# Patient Record
Sex: Male | Born: 1975
Health system: Southern US, Community
[De-identification: ages and names within clinical notes are randomized; demographics above are authoritative.]

## PROBLEM LIST (undated history)

## (undated) DIAGNOSIS — K219 Gastro-esophageal reflux disease without esophagitis: Secondary | ICD-10-CM

## (undated) HISTORY — DX: Gastro-esophageal reflux disease without esophagitis: K21.9

---

## 2002-11-03 ENCOUNTER — Emergency Department (HOSPITAL_COMMUNITY): Admission: EM | Admit: 2002-11-03 | Discharge: 2002-11-03 | Payer: Self-pay | Admitting: Emergency Medicine

## 2012-09-03 ENCOUNTER — Ambulatory Visit: Payer: Self-pay | Admitting: Internal Medicine

## 2012-09-03 ENCOUNTER — Ambulatory Visit: Payer: Self-pay

## 2012-09-03 VITALS — BP 112/60 | HR 54 | Temp 97.8°F | Resp 16 | Ht 68.0 in | Wt 189.0 lb

## 2012-09-03 DIAGNOSIS — R002 Palpitations: Secondary | ICD-10-CM

## 2012-09-03 DIAGNOSIS — R079 Chest pain, unspecified: Secondary | ICD-10-CM

## 2012-09-03 DIAGNOSIS — R0602 Shortness of breath: Secondary | ICD-10-CM

## 2012-09-03 MED ORDER — ALBUTEROL SULFATE HFA 108 (90 BASE) MCG/ACT IN AERS: 2.0000 | INHALATION_SPRAY | Freq: Four times a day (QID) | RESPIRATORY_TRACT | Status: DC | PRN

## 2012-09-03 NOTE — Patient Instructions (Signed)
Nicotine chewing gum What is this medicine? NICOTINE (NIK [place] teen) helps people stop smoking. This medicine replaces the nicotine found in cigarettes and helps to decrease withdrawal effects. It is most effective when used in combination with a stop-smoking program. This medicine may be used for other purposes; ask your health care provider or pharmacist if you have questions. What should I tell my health care provider before I take this medicine? They need to know if you have any of these conditions: -diabetes -heart disease, angina, irregular heartbeat or previous heart attack -lung disease, including asthma -overactive thyroid -pheochromocytoma -stomach problems or ulcers -an unusual or allergic reaction to nicotine, other medicines, foods, dyes, or preservatives -pregnant or trying to get pregnant -breast-feeding How should I use this medicine? Chew but do not swallow the gum. Follow the directions that come with the chewing gum. Use exactly as directed. When you feel an urgent desire for a cigarette, chew one piece of gum slowly. Continue chewing until you taste the gum or feel a slight tingling in your mouth. Then, stop chewing and place the gum between your cheek and gum. Wait until the taste or tingling is almost gone then start chewing again. Continue chewing in this manner for about 30 minutes. Slow chewing helps reduce cravings and also helps reduce the chance for heartburn or other gastrointestinal side effects. Talk to your pediatrician regarding the use of this medicine in children. Special care may be needed. Overdosage: If you think you have taken too much of this medicine contact a poison control center or emergency room at once. NOTE: This medicine is only for you. Do not share this medicine with others. What if I miss a dose? This does not apply. Only use the chewing gum when you have a strong desire to smoke. Do not use more than one piece of gum at a time. What may interact  with this medicine? -medicines for asthma -medicines for blood pressure -medicines for mental depression This list may not describe all possible interactions. Give your health care provider a list of all the medicines, herbs, non-prescription drugs, or dietary supplements you use. Also tell them if you smoke, drink alcohol, or use illegal drugs. Some items may interact with your medicine. What should I watch for while using this medicine? Always carry the nicotine gum with you. Do not smoke while you are using the chewing gum. Do not use more than 30 pieces of gum a day. Too much gum can increase the risk of an overdose. As the urge to smoke gets less, gradually reduce the number of pieces each day over a period of 2 to 3 months. When you are only using 1 or 2 pieces a day, stop using the nicotine gum. If your mouth gets sore from chewing the gum, suck hard sugarless candy between pieces of gum to help relieve the soreness. Brush your teeth regularly to reduce mouth irritation. If you wear dentures, contact your doctor or health care professional if the gum sticks to your dental work. If you are a diabetic and you quit smoking, the effects of insulin may be increased and you may need to reduce your insulin dose. Check with your doctor or health care professional about how you should adjust your insulin dose. What side effects may I notice from receiving this medicine? Side effects that you should report to your doctor or health care professional as soon as possible: -allergic reactions like skin rash, itching or hives, swelling of the face,  lips, or tongue -blisters in mouth -breathing problems -changes in hearing -changes in vision -chest pain -cold sweats -confusion -fast, irregular heartbeat -feeling faint or lightheaded, falls -headache -increased saliva -nausea, vomiting -stomach pain -weakness Side effects that usually do not require medical attention (report to your doctor or health  care professional if they continue or are bothersome): -diarrhea -dry mouth -hiccups -irritability -nervousness or restlessness -trouble sleeping or vivid dreams This list may not describe all possible side effects. Call your doctor for medical advice about side effects. You may report side effects to FDA at 1-800-FDA-1088. Where should I keep my medicine? Keep out of the reach of children. Store at room temperature between 15 and 30 degrees C (59 and 86 degrees F). Protect from heat and light. Throw away unused medicine after the expiration date. NOTE: This sheet is a summary. It may not cover all possible information. If you have questions about this medicine, talk to your doctor, pharmacist, or health care provider.  2012, [employer]. (05/22/2010 1:00:52 PM)Smoking Cessation, Tips for Success YOU CAN QUIT SMOKING If you are ready to quit smoking, congratulations! You have chosen to help yourself be healthier. Cigarettes bring nicotine, tar, carbon monoxide, and other irritants into your body. Your lungs, heart, and blood vessels will be able to work better without these poisons. There are many different ways to quit smoking. Nicotine gum, nicotine patches, a nicotine inhaler, or nicotine nasal spray can help with physical craving. Hypnosis, support groups, and medicines help break the habit of smoking. Here are some tips to help you quit for good.  Throw away all cigarettes.  Clean and remove all ashtrays from your home, work, and car.  On a card, write down your reasons for quitting. Carry the card with you and read it when you get the urge to smoke.  Cleanse your body of nicotine. Drink enough water and fluids to keep your urine clear or pale yellow. Do this after quitting to flush the nicotine from your body.  Learn to predict your moods. Do not let a bad situation be your excuse to have a cigarette. Some situations in your life might tempt you into wanting a  cigarette.  Never have "just one" cigarette. It leads to wanting another and another. Remind yourself of your decision to quit.  Change habits associated with smoking. If you smoked while driving or when feeling stressed, try other activities to replace smoking. Stand up when drinking your coffee. Brush your teeth after eating. Sit in a different chair when you read the paper. Avoid alcohol while trying to quit, and try to drink fewer caffeinated beverages. Alcohol and caffeine may urge you to smoke.  Avoid foods and drinks that can trigger a desire to smoke, such as sugary or spicy foods and alcohol.  Ask people who smoke not to smoke around you.  Have something planned to do right after eating or having a cup of coffee. Take a walk or exercise to perk you up. This will help to keep you from overeating.  Try a relaxation exercise to calm you down and decrease your stress. Remember, you may be tense and nervous for the first 2 weeks after you quit, but this will pass.  Find new activities to keep your hands busy. Play with a pen, coin, or rubber band. Doodle or draw things on paper.  Brush your teeth right after eating. This will help cut down on the craving for the taste of tobacco after meals. You can  try mouthwash, too.  Use oral substitutes, such as lemon drops, carrots, a cinnamon stick, or chewing gum, in place of cigarettes. Keep them handy so they are available when you have the urge to smoke.  When you have the urge to smoke, try deep breathing.  Designate your home as a nonsmoking area.  If you are a heavy smoker, ask your caregiver about a prescription for nicotine chewing gum. It can ease your withdrawal from nicotine.  Reward yourself. Set aside the cigarette money you save and buy yourself something nice.  Look for support from others. Join a support group or smoking cessation program. Ask someone at home or at work to help you with your plan to quit smoking.  Always ask  yourself, "Do I need this cigarette or is this just a reflex?" Tell yourself, "Today, I choose not to smoke," or "I do not want to smoke." You are reminding yourself of your decision to quit, even if you do smoke a cigarette. HOW WILL I FEEL WHEN I QUIT SMOKING?  The benefits of not smoking start within days of quitting.  You may have symptoms of withdrawal because your body is used to nicotine (the addictive substance in cigarettes). You may crave cigarettes, be irritable, feel very hungry, cough often, get headaches, or have difficulty concentrating.  The withdrawal symptoms are only temporary. They are strongest when you first quit but will go away within 10 to 14 days.  When withdrawal symptoms occur, stay in control. Think about your reasons for quitting. Remind yourself that these are signs that your body is healing and getting used to being without cigarettes.  Remember that withdrawal symptoms are easier to treat than the major diseases that smoking can cause.  Even after the withdrawal is over, expect periodic urges to smoke. However, these cravings are generally short-lived and will go away whether you smoke or not. Do not smoke!  If you relapse and smoke again, do not lose hope. Most smokers quit 3 times before they are successful.  If you relapse, do not give up! Plan ahead and think about what you will do the next time you get the urge to smoke. LIFE AS A NONSMOKER: MAKE IT FOR A MONTH, MAKE IT FOR LIFE Day 1: Hang this page where you will see it every day. Day 2: Get rid of all ashtrays, matches, and lighters. Day 3: Drink water. Breathe deeply between sips. Day 4: Avoid places with smoke-filled air, such as bars, clubs, or the smoking section of restaurants. Day 5: Keep track of how much money you save by not smoking. Day 6: Avoid boredom. Keep a good book with you or go to the movies. Day 7: Reward yourself! One week without smoking! Day 8: Make a dental appointment to get  your teeth cleaned. Day 9: Decide how you will turn down a cigarette before it is offered to you. Day 10: Review your reasons for quitting. Day 11: Distract yourself. Stay active to keep your mind off smoking and to relieve tension. Take a walk, exercise, read a book, do a crossword puzzle, or try a new hobby. Day 12: Exercise. Get off the bus before your stop or use stairs instead of escalators. Day 13: Call on friends for support and encouragement. Day 14: Reward yourself! Two weeks without smoking! Day 15: Practice deep breathing exercises. Day 16: Bet a friend that you can stay a nonsmoker. Day 17: Ask to sit in nonsmoking sections of restaurants. Day 18: [employer]  up "No Smoking" signs. Day 19: Think of yourself as a nonsmoker. Day 20: Each morning, tell yourself you will not smoke. Day 21: Reward yourself! Three weeks without smoking! Day 22: Think of smoking in negative ways. Remember how it stains your teeth, gives you bad breath, and leaves you short of breath. Day 23: Eat a nutritious breakfast. Day 24:Do not relive your days as a smoker. Day 25: Hold a pencil in your hand when talking on the telephone. Day 26: Tell all your friends you do not smoke. Day 27: Think about how much better food tastes. Day 28: Remember, one cigarette is one too many. Day 29: Take up a hobby that will keep your hands busy. Day 30: Congratulations! One month without smoking! Give yourself a big reward. Your caregiver can direct you to community resources or hospitals for support, which may include:  Group support.  Education.  Hypnosis.  Subliminal therapy. Document Released: 12/15/2003 Document Revised: 06/10/2011 Document Reviewed: 01/02/2009 [facility] Patient Information 2014 [place], [place].

## 2012-09-03 NOTE — Progress Notes (Signed)
  Subjective:    Patient ID: Brandon Ward, male    DOB: Oct 21, 1975, 37 y.o.   MRN: [id number]  HPI Speaks poor english, is anxious, smoking 2 packs per day. Hard to catch breath or take deep breath. No hemoptysis, minimal cough. Is ready to stop smoking. Stat EKG is normal   Review of Systems     Objective:   Physical Exam  Vitals reviewed. Constitutional: He is oriented to person, place, and time. He appears well-developed and well-nourished. No distress.  HENT:  Right Ear: External ear normal.  Left Ear: External ear normal.  Mouth/Throat: Posterior oropharyngeal erythema present.  Eyes: Conjunctivae and EOM are normal. Pupils are equal, round, and reactive to light.  Cardiovascular: Normal rate, regular rhythm, normal heart sounds and intact distal pulses.   Pulmonary/Chest: Not tachypneic. No respiratory distress. He has decreased breath sounds. He has no wheezes. He has rhonchi. He has no rales. He exhibits no tenderness.  Neurological: He is alert and oriented to person, place, and time. No cranial nerve deficit. He exhibits normal muscle tone. Coordination normal.  Skin: No rash noted.  Psychiatric: His speech is normal and behavior is normal. Judgment and thought content normal. His mood appears anxious. Cognition and memory are normal.     [facility] reading (PRIMARY) by  Dr Perrin Maltese Hyperinflation, 2pk/day smoker  No results found for this or any previous visit.       Assessment & Plan:  Quit smoking/Nicorrette Albuterol inhaler qid prn Reck Sunday 3pm

## 2014-06-08 ENCOUNTER — Encounter (HOSPITAL_COMMUNITY): Payer: Self-pay | Admitting: Emergency Medicine

## 2014-06-08 ENCOUNTER — Emergency Department (HOSPITAL_COMMUNITY)
Admission: EM | Admit: 2014-06-08 | Discharge: 2014-06-08 | Disposition: A | Discharge status: Home / Self Care | Payer: Self-pay | Attending: Emergency Medicine | Admitting: Emergency Medicine

## 2014-06-08 ENCOUNTER — Emergency Department (HOSPITAL_COMMUNITY): Payer: Self-pay

## 2014-06-08 DIAGNOSIS — R0600 Dyspnea, unspecified: Secondary | ICD-10-CM

## 2014-06-08 DIAGNOSIS — F17210 Nicotine dependence, cigarettes, uncomplicated: Secondary | ICD-10-CM

## 2014-06-08 DIAGNOSIS — R0609 Other forms of dyspnea: Secondary | ICD-10-CM | POA: Insufficient documentation

## 2014-06-08 DIAGNOSIS — R0602 Shortness of breath: Secondary | ICD-10-CM

## 2014-06-08 DIAGNOSIS — Z720 Tobacco use: Secondary | ICD-10-CM | POA: Insufficient documentation

## 2014-06-08 DIAGNOSIS — Z79899 Other long term (current) drug therapy: Secondary | ICD-10-CM | POA: Insufficient documentation

## 2014-06-08 LAB — CBC WITH DIFFERENTIAL/PLATELET
BASOS PCT: 0 % (ref 0–1)
Basophils Absolute: 0 10*3/uL (ref 0.0–0.1)
EOS PCT: 4 % (ref 0–5)
Eosinophils Absolute: 0.3 10*3/uL (ref 0.0–0.7)
HCT: 45.4 % (ref 39.0–52.0)
Hemoglobin: 15.4 g/dL (ref 13.0–17.0)
LYMPHS ABS: 2.3 10*3/uL (ref 0.7–4.0)
Lymphocytes Relative: 34 % (ref 12–46)
MCH: 30.3 pg (ref 26.0–34.0)
MCHC: 33.9 g/dL (ref 30.0–36.0)
MCV: 89.2 fL (ref 78.0–100.0)
Monocytes Absolute: 0.4 10*3/uL (ref 0.1–1.0)
Monocytes Relative: 6 % (ref 3–12)
NEUTROS ABS: 3.9 10*3/uL (ref 1.7–7.7)
NEUTROS PCT: 56 % (ref 43–77)
PLATELETS: 250 10*3/uL (ref 150–400)
RBC: 5.09 MIL/uL (ref 4.22–5.81)
RDW: 13.1 % (ref 11.5–15.5)
WBC: 6.9 10*3/uL (ref 4.0–10.5)

## 2014-06-08 LAB — BASIC METABOLIC PANEL
ANION GAP: 4 — AB (ref 5–15)
BUN: 15 mg/dL (ref 6–23)
CALCIUM: 9.3 mg/dL (ref 8.4–10.5)
CHLORIDE: 105 mmol/L (ref 96–112)
CO2: 27 mmol/L (ref 19–32)
Creatinine, Ser: 0.93 mg/dL (ref 0.50–1.35)
GFR calc Af Amer: >90 mL/min (ref >90)
GFR calc non Af Amer: >90 mL/min (ref >90)
Glucose, Bld: 103 mg/dL — ABNORMAL HIGH (ref 70–99)
Potassium: 4 mmol/L (ref 3.5–5.1)
SODIUM: 136 mmol/L (ref 135–145)

## 2014-06-08 LAB — I-STAT TROPONIN, ED: TROPONIN I, POC: 0 ng/mL (ref 0.00–0.08)

## 2014-06-08 MED ORDER — ALBUTEROL SULFATE HFA 108 (90 BASE) MCG/ACT IN AERS
2.0000 | INHALATION_SPRAY | RESPIRATORY_TRACT | Status: DC | PRN
  Administered 2014-06-08: 2 via RESPIRATORY_TRACT
  Filled 2014-06-08: qty 6.7

## 2014-06-08 MED ORDER — PREDNISONE 10 MG PO TABS: 20.0000 mg | ORAL_TABLET | Freq: Every day | ORAL | Status: DC

## 2014-06-08 NOTE — Discharge Instructions (Signed)
Smoking Cessation Quitting smoking is important to your health and has many advantages. However, it is not always easy to quit since nicotine is a very addictive drug. Oftentimes, people try 3 times or more before being able to quit. This document explains the best ways for you to prepare to quit smoking. Quitting takes hard work and a lot of effort, but you can do it. ADVANTAGES OF QUITTING SMOKING  You will live longer, feel better, and live better.  Your body will feel the impact of quitting smoking almost immediately.  Within 20 minutes, blood pressure decreases. Your pulse returns to its normal level.  After 8 hours, carbon monoxide levels in the blood return to normal. Your oxygen level increases.  After 24 hours, the chance of having a heart attack starts to decrease. Your breath, hair, and body stop smelling like smoke.  After 48 hours, damaged nerve endings begin to recover. Your sense of taste and smell improve.  After 72 hours, the body is virtually free of nicotine. Your bronchial tubes relax and breathing becomes easier.  After 2 to 12 weeks, lungs can hold more air. Exercise becomes easier and circulation improves.  The risk of having a heart attack, stroke, cancer, or lung disease is greatly reduced.  After 1 year, the risk of coronary heart disease is cut in half.  After 5 years, the risk of stroke falls to the same as a nonsmoker.  After 10 years, the risk of lung cancer is cut in half and the risk of other cancers decreases significantly.  After 15 years, the risk of coronary heart disease drops, usually to the level of a nonsmoker.  If you are pregnant, quitting smoking will improve your chances of having a healthy baby.  The people you live with, especially any children, will be healthier.  You will have extra money to spend on things other than cigarettes. QUESTIONS TO THINK ABOUT BEFORE ATTEMPTING TO QUIT You may want to talk about your answers with your  health care provider.  Why do you want to quit?  If you tried to quit in the past, what helped and what did not?  What will be the most difficult situations for you after you quit? How will you plan to handle them?  Who can help you through the tough times? Your family? Friends? A health care provider?  What pleasures do you get from smoking? What ways can you still get pleasure if you quit? Here are some questions to ask your health care provider:  How can you help me to be successful at quitting?  What medicine do you think would be best for me and how should I take it?  What should I do if I need more help?  What is smoking withdrawal like? How can I get information on withdrawal? GET READY  Set a quit date.  Change your environment by getting rid of all cigarettes, ashtrays, matches, and lighters in your home, car, or work. Do not let people smoke in your home.  Review your past attempts to quit. Think about what worked and what did not. GET SUPPORT AND ENCOURAGEMENT You have a better chance of being successful if you have help. You can get support in many ways.  Tell your family, friends, and coworkers that you are going to quit and need their support. Ask them not to smoke around you.  Get individual, group, or telephone counseling and support. Programs are available at local hospitals and health centers. Call   your local health department for information about programs in your area.  Spiritual beliefs and practices may help some smokers quit.  Download a "quit meter" on your computer to keep track of quit statistics, such as how long you have gone without smoking, cigarettes not smoked, and money saved.  Get a self-help book about quitting smoking and staying off tobacco. LEARN NEW SKILLS AND BEHAVIORS  Distract yourself from urges to smoke. Talk to someone, go for a walk, or occupy your time with a task.  Change your normal routine. Take a different route to work.  Drink tea instead of coffee. Eat breakfast in a different place.  Reduce your stress. Take a hot bath, exercise, or read a book.  Plan something enjoyable to do every day. Reward yourself for not smoking.  Explore interactive web-based programs that specialize in helping you quit. GET MEDICINE AND USE IT CORRECTLY Medicines can help you stop smoking and decrease the urge to smoke. Combining medicine with the above behavioral methods and support can greatly increase your chances of successfully quitting smoking.  Nicotine replacement therapy helps deliver nicotine to your body without the negative effects and risks of smoking. Nicotine replacement therapy includes nicotine gum, lozenges, inhalers, nasal sprays, and skin patches. Some may be available over-the-counter and others require a prescription.  Antidepressant medicine helps people abstain from smoking, but how this works is unknown. This medicine is available by prescription.  Nicotinic receptor partial agonist medicine simulates the effect of nicotine in your brain. This medicine is available by prescription. Ask your health care provider for advice about which medicines to use and how to use them based on your health history. Your health care provider will tell you what side effects to look out for if you choose to be on a medicine or therapy. Carefully read the information on the package. Do not use any other product containing nicotine while using a nicotine replacement product.  RELAPSE OR DIFFICULT SITUATIONS Most relapses occur within the first 3 months after quitting. Do not be discouraged if you start smoking again. Remember, most people try several times before finally quitting. You may have symptoms of withdrawal because your body is used to nicotine. You may crave cigarettes, be irritable, feel very hungry, cough often, get headaches, or have difficulty concentrating. The withdrawal symptoms are only temporary. They are strongest  when you first quit, but they will go away within 10-14 days. To reduce the chances of relapse, try to:  Avoid drinking alcohol. Drinking lowers your chances of successfully quitting.  Reduce the amount of caffeine you consume. Once you quit smoking, the amount of caffeine in your body increases and can give you symptoms, such as a rapid heartbeat, sweating, and anxiety.  Avoid smokers because they can make you want to smoke.  Do not let weight gain distract you. Many smokers will gain weight when they quit, usually less than 10 pounds. Eat a healthy diet and stay active. You can always lose the weight gained after you quit.  Find ways to improve your mood other than smoking. FOR MORE INFORMATION  www.smokefree.gov  Document Released: 03/12/2001 Document Revised: 08/02/2013 Document Reviewed: 06/27/2011 [employer] Patient Information 2015 [employer]. This information is not intended to replace advice given to you by your health care provider. Make sure you discuss any questions you have with your health care provider.  

## 2014-06-08 NOTE — ED Provider Notes (Signed)
CSN: [id number]     Arrival date & time 06/08/14  1447 History   First MD Initiated Contact with Patient 06/08/14 1511     Chief Complaint  Patient presents with  . Shortness of Breath     (Consider location/radiation/quality/duration/timing/severity/associated sxs/prior Treatment) HPI   PCP: No PCP Per Patient Blood pressure 148/75, pulse 68, temperature 97.5 F (36.4 C), temperature source Oral, resp. rate 18, SpO2 100 %.  Brandon BalSaid Ward is a 39 y.o.male without any significant PMH presents to the ER with complaints of shortness of breath intermittently for the past two months. He is a long time 1 pack per day smoker. He typically can play in soccer games without becoming short of breath but in the past twp months he becomes extremely short of breath and requires increasingly more use of his inhaler. Denies CP, diaphoresis, nausea, confusion.   History reviewed. No pertinent past medical history. History reviewed. No pertinent past surgical history. No family history on file. History  Substance Use Topics  . Smoking status: Current Every Day Smoker    Types: Cigarettes  . Smokeless tobacco: Not on file     Comment: 1.5 pack/day  . Alcohol Use: Yes     Comment: sometimes    Review of Systems  10 Systems reviewed and are negative for acute change except as noted in the HPI.     Allergies  Pain & fever  Home Medications   Prior to Admission medications   Medication Sig Start Date End Date Taking? Authorizing Provider  ibuprofen (ADVIL,MOTRIN) 200 MG tablet Take 200 mg by mouth every 6 (six) hours as needed.   Yes Historical Provider, MD  albuterol (PROVENTIL HFA;VENTOLIN HFA) 108 (90 BASE) MCG/ACT inhaler Inhale 2 puffs into the lungs every 6 (six) hours as needed for wheezing. Patient not taking: Reported on 06/08/2014 09/03/12   Jonita Albeehris W Guest, MD  predniSONE (DELTASONE) 10 MG tablet Take 2 tablets (20 mg total) by mouth daily. 06/08/14   [name], PA-C   BP 148/75 mmHg   Pulse 68  Temp(Src) 97.5 F (36.4 C) (Oral)  Resp 18  SpO2 100% Physical Exam  Constitutional: He appears well-developed and well-nourished. No distress.  HENT:  Head: Normocephalic and atraumatic.  Eyes: Pupils are equal, round, and reactive to light.  Neck: Normal range of motion. Neck supple.  Cardiovascular: Normal rate and regular rhythm.   Pulmonary/Chest: Effort normal and breath sounds normal. No accessory muscle usage. No respiratory distress. He has no wheezes. He exhibits no tenderness, no bony tenderness and no laceration.  Abdominal: Soft.  Neurological: He is alert.  Skin: Skin is warm and dry.  Nursing note and vitals reviewed.   ED Course  Procedures (including critical care time) Labs Review Labs Reviewed  BASIC METABOLIC PANEL - Abnormal; Notable for the following:    Glucose, Bld 103 (*)    Anion gap 4 (*)    All other components within normal limits  CBC WITH DIFFERENTIAL/PLATELET  I-STAT TROPOININ, ED    Imaging Review No results found.   EKG Interpretation   Date/Time:  Wednesday June 08 2014 16:05:25 EST Ventricular Rate:  60 PR Interval:  161 QRS Duration: 74 QT Interval:  395 QTC Calculation: 395 R Axis:   57 Text Interpretation:  Sinus rhythm Probable anteroseptal infarct, old No  old tracing to compare Confirmed by CAMPOS  MD, Caryn BeeKEVIN ([id number]) on 06/08/2014  4:08:04 PM      MDM   Final diagnoses:  Shortness of  breath  Smoking greater than 20 pack years  DOE (dyspnea on exertion)    The patients pain is atypical for cardiac related pain. He has SOB that is exacerbated by increased physical activity. He is not sure if he has wheezing with this. He has been a 20 pack per year smoker and endorses he still smokes. His SOB symptoms is worsened by smoking. When he does not smoke for a few days he notices improvement.  He has had a negative troponin, normal CBC and BMP. His chest xray did not show any acute findings.  Rx Prednisone, pt has  albuterol inhaler at home but given one here. discussed smoking cessation and need for PCP. I got referral from case management to give to patient for follow-up.  39 y.o.Brandon Ward's evaluation in the Emergency Department is complete. It has been determined that no acute conditions requiring further emergency intervention are present at this time. The patient/guardian have been advised of the diagnosis and plan. We have discussed signs and symptoms that warrant return to the ED, such as changes or worsening in symptoms.  Vital signs are stable at discharge. Filed Vitals:   06/08/14 1500  BP: 148/75  Pulse: 68  Temp: 97.5 F (36.4 C)  Resp: 18    Patient/guardian has voiced understanding and agreed to follow-up with the PCP or specialist.     Marlon Pel, PA-C 06/13/14 1619  Rolan Bucco, MD 06/13/14 1943

## 2014-06-08 NOTE — ED Notes (Signed)
Pt states that for several months at night he wakes up bc he cant breathe, he will get his breath and then go back to sleep.  Pt states that when he was jogging during a soccer game he had the same shob and had to stop playing.  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  Pt states that he smokes a lot.

## 2014-08-20 ENCOUNTER — Emergency Department (HOSPITAL_COMMUNITY)
Admission: EM | Admit: 2014-08-20 | Discharge: 2014-08-20 | Disposition: A | Discharge status: Home / Self Care | Payer: Self-pay | Attending: Emergency Medicine | Admitting: Emergency Medicine

## 2014-08-20 ENCOUNTER — Encounter (HOSPITAL_COMMUNITY): Payer: Self-pay | Admitting: Emergency Medicine

## 2014-08-20 ENCOUNTER — Emergency Department (HOSPITAL_COMMUNITY): Payer: Self-pay

## 2014-08-20 DIAGNOSIS — R0600 Dyspnea, unspecified: Secondary | ICD-10-CM | POA: Insufficient documentation

## 2014-08-20 DIAGNOSIS — Z720 Tobacco use: Secondary | ICD-10-CM | POA: Insufficient documentation

## 2014-08-20 DIAGNOSIS — Z79899 Other long term (current) drug therapy: Secondary | ICD-10-CM | POA: Insufficient documentation

## 2014-08-20 DIAGNOSIS — R0602 Shortness of breath: Secondary | ICD-10-CM | POA: Insufficient documentation

## 2014-08-20 DIAGNOSIS — R509 Fever, unspecified: Secondary | ICD-10-CM | POA: Insufficient documentation

## 2014-08-20 MED ORDER — ALBUTEROL SULFATE HFA 108 (90 BASE) MCG/ACT IN AERS
2.0000 | INHALATION_SPRAY | RESPIRATORY_TRACT | Status: DC | PRN
  Administered 2014-08-20: 2 via RESPIRATORY_TRACT
  Filled 2014-08-20: qty 6.7

## 2014-08-20 MED ORDER — PREDNISONE 10 MG PO TABS: 20.0000 mg | ORAL_TABLET | Freq: Every day | ORAL | Status: DC

## 2014-08-20 NOTE — Discharge Instructions (Signed)
Smoking Cessation Quitting smoking is important to your health and has many advantages. However, it is not always easy to quit since nicotine is a very addictive drug. Oftentimes, people try 3 times or more before being able to quit. This document explains the best ways for you to prepare to quit smoking. Quitting takes hard work and a lot of effort, but you can do it. ADVANTAGES OF QUITTING SMOKING  You will live longer, feel better, and live better.  Your body will feel the impact of quitting smoking almost immediately.  Within 20 minutes, blood pressure decreases. Your pulse returns to its normal level.  After 8 hours, carbon monoxide levels in the blood return to normal. Your oxygen level increases.  After 24 hours, the chance of having a heart attack starts to decrease. Your breath, hair, and body stop smelling like smoke.  After 48 hours, damaged nerve endings begin to recover. Your sense of taste and smell improve.  After 72 hours, the body is virtually free of nicotine. Your bronchial tubes relax and breathing becomes easier.  After 2 to 12 weeks, lungs can hold more air. Exercise becomes easier and circulation improves.  The risk of having a heart attack, stroke, cancer, or lung disease is greatly reduced.  After 1 year, the risk of coronary heart disease is cut in half.  After 5 years, the risk of stroke falls to the same as a nonsmoker.  After 10 years, the risk of lung cancer is cut in half and the risk of other cancers decreases significantly.  After 15 years, the risk of coronary heart disease drops, usually to the level of a nonsmoker.  If you are pregnant, quitting smoking will improve your chances of having a healthy baby.  The people you live with, especially any children, will be healthier.  You will have extra money to spend on things other than cigarettes. QUESTIONS TO THINK ABOUT BEFORE ATTEMPTING TO QUIT You may want to talk about your answers with your  health care provider.  Why do you want to quit?  If you tried to quit in the past, what helped and what did not?  What will be the most difficult situations for you after you quit? How will you plan to handle them?  Who can help you through the tough times? Your family? Friends? A health care provider?  What pleasures do you get from smoking? What ways can you still get pleasure if you quit? Here are some questions to ask your health care provider:  How can you help me to be successful at quitting?  What medicine do you think would be best for me and how should I take it?  What should I do if I need more help?  What is smoking withdrawal like? How can I get information on withdrawal? GET READY  Set a quit date.  Change your environment by getting rid of all cigarettes, ashtrays, matches, and lighters in your home, car, or work. Do not let people smoke in your home.  Review your past attempts to quit. Think about what worked and what did not. GET SUPPORT AND ENCOURAGEMENT You have a better chance of being successful if you have help. You can get support in many ways.  Tell your family, friends, and coworkers that you are going to quit and need their support. Ask them not to smoke around you.  Get individual, group, or telephone counseling and support. Programs are available at [employer] and health centers. Call  your local health department for information about programs in your area.  Spiritual beliefs and practices may help some smokers quit.  Download a "quit meter" on your computer to keep track of quit statistics, such as how long you have gone without smoking, cigarettes not smoked, and money saved.  Get a self-help book about quitting smoking and staying off tobacco. LEARN NEW SKILLS AND BEHAVIORS  Distract yourself from urges to smoke. Talk to someone, go for a walk, or occupy your time with a task.  Change your normal routine. Take a different route to work.  Drink tea instead of coffee. Eat breakfast in a different place.  Reduce your stress. Take a hot bath, exercise, or read a book.  Plan something enjoyable to do every day. Reward yourself for not smoking.  Explore interactive web-based programs that specialize in helping you quit. GET MEDICINE AND USE IT CORRECTLY Medicines can help you stop smoking and decrease the urge to smoke. Combining medicine with the above behavioral methods and support can greatly increase your chances of successfully quitting smoking.  Nicotine replacement therapy helps deliver nicotine to your body without the negative effects and risks of smoking. Nicotine replacement therapy includes nicotine gum, lozenges, inhalers, nasal sprays, and skin patches. Some may be available over-the-counter and others require a prescription.  Antidepressant medicine helps people abstain from smoking, but how this works is unknown. This medicine is available by prescription.  Nicotinic receptor partial agonist medicine simulates the effect of nicotine in your brain. This medicine is available by prescription. Ask your health care provider for advice about which medicines to use and how to use them based on your health history. Your health care provider will tell you what side effects to look out for if you choose to be on a medicine or therapy. Carefully read the information on the package. Do not use any other product containing nicotine while using a nicotine replacement product.  RELAPSE OR DIFFICULT SITUATIONS Most relapses occur within the first 3 months after quitting. Do not be discouraged if you start smoking again. Remember, most people try several times before finally quitting. You may have symptoms of withdrawal because your body is used to nicotine. You may crave cigarettes, be irritable, feel very hungry, cough often, get headaches, or have difficulty concentrating. The withdrawal symptoms are only temporary. They are strongest  when you first quit, but they will go away within 10-14 days. To reduce the chances of relapse, try to:  Avoid drinking alcohol. Drinking lowers your chances of successfully quitting.  Reduce the amount of caffeine you consume. Once you quit smoking, the amount of caffeine in your body increases and can give you symptoms, such as a rapid heartbeat, sweating, and anxiety.  Avoid smokers because they can make you want to smoke.  Do not let weight gain distract you. Many smokers will gain weight when they quit, usually less than 10 pounds. Eat a healthy diet and stay active. You can always lose the weight gained after you quit.  Find ways to improve your mood other than smoking. FOR MORE INFORMATION  www.smokefree.gov  Document Released: 03/12/2001 Document Revised: 08/02/2013 Document Reviewed: 06/27/2011 [facility] Patient Information 2015 [place], [place]. This information is not intended to replace advice given to you by your health care provider. Make sure you discuss any questions you have with your health care provider.   Emergency Department Resource Guide 1) Find a Doctor and Pay Out of Pocket Although you won't have to find out  who is covered by your insurance plan, it is a good idea to ask around and get recommendations. You will then need to call the office and see if the doctor you have chosen will accept you as a new patient and what types of options they offer for patients who are self-pay. Some doctors offer discounts or will set up payment plans for their patients who do not have insurance, but you will need to ask so you aren't surprised when you get to your appointment.  2) Contact Your Local Health Department Not all health departments have doctors that can see patients for sick visits, but many do, so it is worth a call to see if yours does. If you don't know where your local health department is, you can check in your phone book. The CDC also has a tool to help you locate your  state's health department, and many state websites also have listings of all of their local health departments.  3) Find a Walk-in Clinic If your illness is not likely to be very severe or complicated, you may want to try a walk in clinic. These are popping up all over the country in pharmacies, drugstores, and shopping centers. They're usually staffed by nurse practitioners or physician assistants that have been trained to treat common illnesses and complaints. They're usually fairly quick and inexpensive. However, if you have serious medical issues or chronic medical problems, these are probably not your best option.  No Primary Care Doctor: - Call Health Connect at  [phone] - they can help you locate a primary care doctor that  accepts your insurance, provides certain services, etc. - Physician Referral Service- [phone]  Chronic Pain Problems: Organization         Address  Phone   Notes  [facility]  [phone] Patients need to be referred by their primary care doctor.   Medication Assistance: Organization         Address  Phone   Notes  [facility] Medication [facility] [address] [place] [place], [place] [address] [phone] --Must be a resident of [facility] -- Must have NO insurance coverage whatsoever (no Medicaid/ Medicare, etc.) -- The pt. MUST have a primary care doctor that directs their care regularly and follows them in the community   [facility]ist  [phone]   [employer]  [phone]    Agencies that provide inexpensive medical care: Organization         Address  Phone   Notes  [facility]  [phone]   [facility]    [phone]   [facility] [address] [place], [place] [address] [phone]   [facility] [address], [place] [phone]   Planned Parenthood    [phone]   [facility]    [phone]   [facility]  [address], [place] Phone:  [phone], Fax:  [phone] Hours of Operation:  9 am - 6 pm, M-F.  Also accepts Medicaid/Medicare and self-pay.  [facility] for Children  [address], [place] Phone: [phone], Fax: [phone]. Hours of Operation:  8:30 am - 5:30 pm, M-F.  Also accepts Medicaid and self-pay.  [facility] [place] [address], [employer] Phone: [phone]   [facility] [address], [place], [place] (  [phone], Ext. [phone] Mondays & Thursdays: 7-9 AM.  First 15 patients are seen on a first come, first serve basis.    Medicaid-accepting [facility] Providers:  Organization         Address  Phone   Notes  [facility] [address], [place] [phone] Also accepts self-pay patients.  [facility] [address] Laurell Josephs [place], [place]  [phone]   [facility] [address], [place] [phone]   [facility] [address] [place] [phone]   Renaye Rakers [address], [place]   [phone] Only accepts [place] Access [place] patients after they have their name applied to their card.   Self-Pay (no insurance) in [facility]:  Organization         Address  Phone   Notes  Sickle Cell Patients, [facility] [address] [place], [place] [phone]   [facility] [address] [place], [place] [phone]   [facility] [place]  [address], [place] [phone]   [facility]/Dr. Osei-Bonsu  [address], [place] or [address], [place] [phone] Phone number for both [place] and [place] locations is the same.  [facility] [address], [place] [phone]   [facility] [address] [place] or [address] [phone]   [facility] [address] [place] [phone], phone; [phone], fax Sees patients 1st and 3rd Saturday of every month.  Must not qualify for public or private insurance (i.e. Medicaid, Medicare, [place] Health Choice, Veterans' Benefits)  Household income should be no more than 200% of the poverty level The clinic cannot treat you if you are pregnant or think you are pregnant  Sexually transmitted diseases are not treated at the clinic.    Dental Care: Organization         Address  Phone  Notes  [facility] Department of [facility] [address] [place], [place] [phone] Accepts children up to age 61 who are enrolled in [place] or [place] Health Choice; pregnant women with a Medicaid card; and children who have applied for Medicaid or [place] Health Choice, but were declined, whose parents can pay a reduced fee at time of service.  [facility] Department of [facility]  [address], [place] [phone] Accepts children up to age 18 who are enrolled in [place] or [place] Health Choice; pregnant women with a Medicaid card; and children who have applied for Medicaid or [place] Health Choice, but were declined, whose parents can pay a reduced fee at time of service.  [facility]  [address] [place], [place] [phone] Patients are seen by appointment only. Walk-ins are not accepted. [facility] will see patients 73 years of age and older. Monday - Tuesday (8am-5pm) Most Wednesdays (8:30-5pm) $30 per visit, cash only  [facility] Adult Dental Access PROGRAM  [address], [facility] [phone] Patients are seen by appointment only. Walk-ins are not accepted. [facility] will see patients 45 years of age and older. One Wednesday Evening (Monthly: Volunteer  Based).  $30 per visit, cash only  [employer]  [phone])  [phone] for adults; Children under age 464, call Graduate Pediatric Dentistry at [phone]. Children aged 864-14, please call [phone] to request a pediatric application.  Dental services are provided in all areas of dental care including fillings, crowns and bridges, complete and partial dentures, implants, gum treatment, root canals, and extractions. Preventive care is also provided. Treatment is provided to both adults and children. Patients are selected via a lottery and there is often a waiting list.   [facility] [address], [place]  [phone] [email or web address]   [facility] [address], [place], [place] [phone], Ext. [phone] Second and Fourth Thursday of each month, opens at 6:30 AM; Clinic ends at 9 AM.  Patients are seen on a first-come first-served basis, and a limited number are seen during each clinic.   [facility]  [address], [place], [place] [phone]   Eligibility Requirements You must have lived in [place], [place], or [place] counties for at least the last three months.   You cannot be eligible for state or federal sponsored [place]healthcare insurance, including [employer]IG[employer]eterans Administration, [place]icaid, or [employer]icare.   You generally cannot be eligible for healthcare insurance through your employer.    How to apply: Eligibility screenings are held every Tuesday and Wednesday afternoon from 1:00 pm until 4:00 pm. You do not need an appointment for the interview!  [facility] [address], [place], [place] [phone]   [facility]  [phone]   [facility]  [phone]   [facility]  [phone]    Behavioral Health Resources in the Community: Intensive Outpatient Programs Organization         Address  Phone  Notes  [facility] [address], [place], [place] [phone]   [facility] [address], [place], [place] [phone]   [facility] [address], [place], [place]  [phone]   [facility] [address],  [place], [place] [phone] or [phone]   Substance Abuse Resources Organization         Address  Phone  Notes  [facility]  [phone]   [facility]  [phone]   [facility]  [phone]   [facility]  [phone]   Residential & Outpatient Substance Abuse Program  [phone]   Psychological Services Organization         Address  Phone  Notes  [facility]  [phone]- [phone]   [facility]  [phone]- [phone]   [facility] [address], [place] [phone] or [phone]    Mobile Crisis Teams Organization         Address  Phone  Notes  [facility], Mobile Crisis Care Unit  [phone]   [facility]  [address] [place], [place] [phone]   Doristine LocksSharon DeEsch [address] [place] [phone]    Self-Help/Support Groups Organization         Address  Phone             Notes  [facility] [place] - variety of support groups  [phone]- [phone] Call for more information  Narcotics Anonymous (NA), [facility] [address], [place]  2 meetings at this location   Chief Executive Officeresidential Treatment Programs Organization         Address  Phone  Notes  [facility] [address]    [place]  [phone]   [facility]  [address], [place] [address], [place], [place] [phone]   [facility] [address] [place], [place] [phone] Admissions: 8am-3pm M-F  [facility] [address]    [place], [place] [phone]   [facility] [address] [place], [place], [place] [phone]    [facility] [address]  [place], [place] [phone]   [facility] - Intensive Outpatient [address]ure[address] Joseph[address], [place], [place] [phone]   [facility] ([facility] [address] [place]  [place], [place] [phone] or [phone]   [facility] ([facility]) [address], [place], [place] [phone] Accepts Medicaid  [facility] [address],  [place] [phone] Substance Abuse/Addiction Treatment   [facility] Organization         Address  Phone  Notes  [facility]  [phone]   Angie Fava, PhD [address] Ervin Knack [place], [place]   [phone] or [phone]   [facility]   [address] [place], [place] [phone]   [facility] [address], [place], [place] [phone] Insurance/Medicaid/sponsorship through [facility] and Families [address]                                    [place], [place] [phone] Therapy/tele-psych/case  [facility] [address] [place], [place] [phone]    Dr. Lolly Mustache  [phone]   [facility]  [employer] [facility] Dept. 1) [address], [place] 2) [address], [place] 3)  [address], [place] [phone]  [phone]   [facility] Child Abuse Hotline [phone] or [phone] (After Hours)

## 2014-08-20 NOTE — ED Notes (Signed)
Pt ambulating independently w/ steady gait on d/c in no acute distress, A&Ox4. D/c instructions reviewed w/ pt and family - pt and family deny any further questions or concerns at present. Rx given x1  

## 2014-08-20 NOTE — ED Notes (Signed)
Pt from home c/o non- productive cough, shortness of breath, and fever x few hours. Pt lung sounds clear. SPO2 100% RA. Denies recent travel.

## 2014-08-20 NOTE — ED Provider Notes (Signed)
CSN: [id number]     Arrival date & time 08/20/14  0235 History   First MD Initiated Contact with Patient 08/20/14 [phone]     Chief Complaint  Patient presents with  . Cough  . Fever     (Consider location/radiation/quality/duration/timing/severity/associated sxs/prior Treatment) Patient is a 39 y.o. male presenting with cough and fever. The history is provided by the patient. No language interpreter was used.  Cough Associated symptoms: shortness of breath   Associated symptoms: no chest pain, no fever and no myalgias   Associated symptoms comment:  The patient comes in for evaluation of period of SOB that occurred around midnight, associated with cough. He used his inhaler on his way to the hospital and reports feeling much better now. No fever. He continues to smoke. No chest pain. Fever Associated symptoms: cough   Associated symptoms: no chest pain, no myalgias, no nausea and no vomiting     History reviewed. No pertinent past medical history. History reviewed. No pertinent past surgical history. No family history on file. History  Substance Use Topics  . Smoking status: Current Every Day Smoker    Types: Cigarettes  . Smokeless tobacco: Not on file     Comment: 1.5 pack/day  . Alcohol Use: Yes     Comment: sometimes    Review of Systems  Constitutional: Negative for fever.  Respiratory: Positive for cough and shortness of breath.   Cardiovascular: Negative for chest pain.  Gastrointestinal: Negative for nausea and vomiting.  Musculoskeletal: Negative for myalgias.      Allergies  Pain & fever  Home Medications   Prior to Admission medications   Medication Sig Start Date End Date Taking? Authorizing Provider  albuterol (PROVENTIL HFA;VENTOLIN HFA) 108 (90 BASE) MCG/ACT inhaler Inhale 2 puffs into the lungs every 6 (six) hours as needed for wheezing. Patient not taking: Reported on 06/08/2014 09/03/12   Jonita Albeehris W Guest, MD  ibuprofen (ADVIL,MOTRIN) 200 MG tablet Take  200 mg by mouth every 6 (six) hours as needed.    Historical Provider, MD  predniSONE (DELTASONE) 10 MG tablet Take 2 tablets (20 mg total) by mouth daily. Patient not taking: Reported on 08/20/2014 06/08/14   Marlon Peliffany Greene, PA-C   BP 143/79 mmHg  Pulse 78  Temp(Src) 98.6 F (37 C) (Oral)  Resp 20  SpO2 100% Physical Exam  Constitutional: He is oriented to person, place, and time. He appears well-developed and well-nourished.  HENT:  Head: Normocephalic.  Neck: Normal range of motion. Neck supple.  Cardiovascular: Normal rate and regular rhythm.   No murmur heard. Pulmonary/Chest: Effort normal and breath sounds normal. He has no wheezes. He has no rales. He exhibits no tenderness.  Abdominal: Soft. Bowel sounds are normal. There is no tenderness. There is no rebound and no guarding.  Musculoskeletal: Normal range of motion.  Neurological: He is alert and oriented to person, place, and time.  Skin: Skin is warm and dry. No rash noted.  Psychiatric: He has a normal mood and affect.    ED Course  Procedures (including critical care time) Labs Review Labs Reviewed - No data to display  Imaging Review Dg Chest 2 View (if Patient Has Fever And/or Copd)  08/20/2014   CLINICAL DATA:  Acute onset of nonproductive cough, shortness of breath and fever. Initial encounter.  EXAM: CHEST  2 VIEW  COMPARISON:  Chest radiograph performed 06/08/2014  FINDINGS: The lungs are well-aerated and clear. There is no evidence of focal opacification, pleural effusion or pneumothorax.  The heart is normal in size; the mediastinal contour is within normal limits. No acute osseous abnormalities are seen.  IMPRESSION: No acute cardiopulmonary process seen.   Electronically Signed   By: Roanna Raider M.D.   On: 08/20/2014 02:56     EKG Interpretation None      MDM   Final diagnoses:  None    1. Dyspnea  He appears comfortable feels subjectively improved with resolved symptoms of SOB. He feels this  may be a result of using the inhaler. Discussed smoking cessation. Will provide steroids for the next few days despite that he states he did not fill the previous prescription. Referred to PCP    Elpidio Anis, PA-C 08/20/14 2240  Tomasita Crumble, MD 08/21/14 1610

## 2014-11-20 ENCOUNTER — Encounter (HOSPITAL_COMMUNITY): Payer: Self-pay | Admitting: Emergency Medicine

## 2014-11-20 ENCOUNTER — Emergency Department (HOSPITAL_COMMUNITY): Payer: Self-pay

## 2014-11-20 ENCOUNTER — Emergency Department (HOSPITAL_COMMUNITY)
Admission: EM | Admit: 2014-11-20 | Discharge: 2014-11-20 | Disposition: A | Discharge status: Home / Self Care | Payer: Self-pay | Attending: Emergency Medicine | Admitting: Emergency Medicine

## 2014-11-20 DIAGNOSIS — Z79899 Other long term (current) drug therapy: Secondary | ICD-10-CM | POA: Insufficient documentation

## 2014-11-20 DIAGNOSIS — R0600 Dyspnea, unspecified: Secondary | ICD-10-CM | POA: Insufficient documentation

## 2014-11-20 DIAGNOSIS — Z720 Tobacco use: Secondary | ICD-10-CM | POA: Insufficient documentation

## 2014-11-20 DIAGNOSIS — R0602 Shortness of breath: Secondary | ICD-10-CM | POA: Insufficient documentation

## 2014-11-20 LAB — CBC WITH DIFFERENTIAL/PLATELET
Basophils Absolute: 0 K/uL (ref 0.0–0.1)
Basophils Relative: 0 % (ref 0–1)
Eosinophils Absolute: 0.2 K/uL (ref 0.0–0.7)
Eosinophils Relative: 3 % (ref 0–5)
HCT: 43.8 % (ref 39.0–52.0)
Hemoglobin: 15.4 g/dL (ref 13.0–17.0)
Lymphocytes Relative: 35 % (ref 12–46)
Lymphs Abs: 2.1 K/uL (ref 0.7–4.0)
MCH: 30.6 pg (ref 26.0–34.0)
MCHC: 35.2 g/dL (ref 30.0–36.0)
MCV: 86.9 fL (ref 78.0–100.0)
Monocytes Absolute: 0.5 K/uL (ref 0.1–1.0)
Monocytes Relative: 9 % (ref 3–12)
Neutro Abs: 3.3 K/uL (ref 1.7–7.7)
Neutrophils Relative %: 53 % (ref 43–77)
Platelets: 268 K/uL (ref 150–400)
RBC: 5.04 MIL/uL (ref 4.22–5.81)
RDW: 12.8 % (ref 11.5–15.5)
WBC: 6.1 K/uL (ref 4.0–10.5)

## 2014-11-20 LAB — COMPREHENSIVE METABOLIC PANEL
ALBUMIN: 4.7 g/dL (ref 3.5–5.0)
ALT: 21 U/L (ref 17–63)
ANION GAP: 9 (ref 5–15)
AST: 17 U/L (ref 15–41)
Alkaline Phosphatase: 102 U/L (ref 38–126)
BILIRUBIN TOTAL: 0.8 mg/dL (ref 0.3–1.2)
BUN: 15 mg/dL (ref 6–20)
CO2: 23 mmol/L (ref 22–32)
Calcium: 9.8 mg/dL (ref 8.9–10.3)
Chloride: 107 mmol/L (ref 101–111)
Creatinine, Ser: 1 mg/dL (ref 0.61–1.24)
GFR calc Af Amer: >60 mL/min (ref >60)
Glucose, Bld: 91 mg/dL (ref 65–99)
POTASSIUM: 3.9 mmol/L (ref 3.5–5.1)
Sodium: 139 mmol/L (ref 135–145)
TOTAL PROTEIN: 7.7 g/dL (ref 6.5–8.1)

## 2014-11-20 LAB — I-STAT TROPONIN, ED: TROPONIN I, POC: 0 ng/mL (ref 0.00–0.08)

## 2014-11-20 LAB — D-DIMER, QUANTITATIVE: D-Dimer, Quant: <0.27 ug{FEU}/mL (ref 0.00–0.48)

## 2014-11-20 NOTE — ED Provider Notes (Signed)
CSN: [id number]     Arrival date & time 11/20/14  1356 History   First MD Initiated Contact with Patient 11/20/14 1604     Chief Complaint  Patient presents with  . Shortness of Breath  . Chest Pain     (Consider location/radiation/quality/duration/timing/severity/associated sxs/prior Treatment) Patient is a 39 y.o. male presenting with shortness of breath and chest pain. The history is provided by the patient.  Shortness of Breath Severity:  Moderate Associated symptoms: chest pain   Associated symptoms: no abdominal pain   Chest Pain Associated symptoms: shortness of breath   Associated symptoms: no abdominal pain and no back pain    patient's had episodes of shortness of breath  over the last few months. Also has dull chest pain. Initially he had episodes of his flank soccer rate have to take a break. Also started to have episodes at rest. His been seen in the ER twice and has been seen by a PCP. He has had occasional cough with hemotysis.   History reviewed. No pertinent past medical history. History reviewed. No pertinent past surgical history. History reviewed. No pertinent family history. Social History  Substance Use Topics  . Smoking status: Current Every Day Smoker    Types: Cigarettes  . Smokeless tobacco: None     Comment: 1.5 pack/day  . Alcohol Use: Yes     Comment: sometimes    Review of Systems  Respiratory: Positive for shortness of breath.   Cardiovascular: Positive for chest pain. Negative for leg swelling.  Gastrointestinal: Negative for abdominal pain.  Genitourinary: Negative for flank pain.  Musculoskeletal: Negative for back pain.  Skin: Negative for wound.  Psychiatric/Behavioral: Negative for confusion.      Allergies  Pain & fever  Home Medications   Prior to Admission medications   Medication Sig Start Date End Date Taking? Authorizing Provider  albuterol (PROVENTIL HFA;VENTOLIN HFA) 108 (90 BASE) MCG/ACT inhaler Inhale 1-2 puffs into  the lungs every 6 (six) hours as needed for wheezing or shortness of breath.   Yes Historical Provider, MD  ALPRAZolam Prudy Feeler) 0.5 MG tablet Take 0.5 mg by mouth at bedtime as needed for anxiety or sleep.   Yes Historical Provider, MD  albuterol (PROVENTIL HFA;VENTOLIN HFA) 108 (90 BASE) MCG/ACT inhaler Inhale 2 puffs into the lungs every 6 (six) hours as needed for wheezing. Patient not taking: Reported on 06/08/2014 09/03/12   Jonita Albee, MD  predniSONE (DELTASONE) 10 MG tablet Take 2 tablets (20 mg total) by mouth daily. Patient not taking: Reported on 11/20/2014 08/20/14   Elpidio Anis, PA-C   BP 112/67 mmHg  Pulse 42  Temp(Src) 97.8 F (36.6 C) (Oral)  Resp 16  SpO2 98% Physical Exam  Constitutional: He appears well-developed and well-nourished.  Eyes: EOM are normal.  Neck: Neck supple.  Cardiovascular: Normal rate and regular rhythm.   Pulmonary/Chest: Effort normal.  Abdominal: Soft.  Musculoskeletal: Normal range of motion.  Neurological: He is alert.  Skin: Skin is warm and dry.  Psychiatric: He has a normal mood and affect.    ED Course  Procedures (including critical care time) Labs Review Labs Reviewed  CBC WITH DIFFERENTIAL/PLATELET  COMPREHENSIVE METABOLIC PANEL  D-DIMER, QUANTITATIVE (NOT AT [facility])  Rosezena Sensor, ED    Imaging Review Dg Chest 2 View  11/20/2014   CLINICAL DATA:  Two week history of left-sided chest pain and shortness of breath. Episode of hemoptysis. Current smoker.  EXAM: CHEST  2 VIEW  COMPARISON:  08/20/2014 and  earlier.  FINDINGS: Cardiomediastinal silhouette unremarkable, unchanged. Lungs clear. Bronchovascular markings normal. Pulmonary vascularity normal. No visible pleural effusions. No pneumothorax. Visualized bony thorax intact. No significant interval change.  IMPRESSION: No acute cardiopulmonary disease.  Stable examination.   Electronically Signed   By: Hulan Saas M.D.   On: 11/20/2014 15:35   I have personally reviewed and  evaluated these images and lab results as part of my medical decision-making.   EKG Interpretation   Date/Time:  Sunday November 20 2014 14:39:29 EDT Ventricular Rate:  56 PR Interval:  162 QRS Duration: 87 QT Interval:  400 QTC Calculation: 386 R Axis:   56 Text Interpretation:  Sinus rhythm Confirmed by Rubin Payor  MD, Harrold Donath  [phone]) on 11/20/2014 4:23:26 PM      MDM   Final diagnoses:  Dyspnea    Patient shortness of breath and occasional chest pain. Had some hemoptysis. Has been seen for this couple times already. No recent travel. Negative d-dimer now. Will have follow-up with pulmonology. Labs and x-ray reassuring.    Benjiman Core, MD 11/21/14 [phone]

## 2014-11-20 NOTE — Discharge Instructions (Signed)
Falta de aire °(Shortness of Breath) °La falta de aire significa que tiene dificultad para respirar. También puede significar que tiene un problema médico. Debe solicitar atención médica de inmediato si siente que le falta el aire. °CAUSAS  °· Insuficiente oxígeno en el aire, como en las grandes alturas o un ambiente lleno de humo. °· Ciertas enfermedades pulmonares, infecciones o problemas. °· Enfermedades o afecciones cardíacas, como angina de pecho o insuficiencia cardíaca. °· Nivel bajo de glóbulos rojos (anemia). °· Condición física deficiente, que puede provocar falta de aire al hacer ejercicio. °· Lesiones o entumecimiento en el pecho o en la espalda. °· Tener sobrepeso. °· Fumar. °· Ansiedad, que puede hacer que sienta que no tiene aire suficiente. °DIAGNÓSTICO  °A menudo, los problemas médicos graves se encuentran durante el examen físico. Para determinar porqué sufre de falta de aire podrán realizarle diferentes pruebas. Entre ellas: °· Radiografía de tórax. °· Pruebas funcionales respiratorias. °· Análisis de sangre. °· Un electrocardiograma (ECG). °· Un electrocardiograma ambulatorio. Un ECG ambulatorio registra los patrones de los latidos cardíacos durante 24 horas. °· Prueba de ejercicio. °· Un ecocardiograma transtorácico (ETT). Durante el ecocardiograma, se usan ondas sonoras para evaluar el flujo de la sangre a través del corazón. °· Un ecocardiograma transesofágico (ETE). °· Diagnósticos por imágenes. °Puede ser que el médico no encuentre una causa para su falta de aire después del examen. En este caso, es importante que concurra a un examen de control, según las indicaciones del médico.  °TRATAMIENTO  °El tratamiento de la falta de aire depende de la causa de sus síntomas y puede variar mucho. °INSTRUCCIONES PARA EL CUIDADO EN EL HOGAR  °· No fumar. Fumar es una causa frecuente de la falta de aire. Si fuma, solicite ayuda para dejar de hacerlo. °· Evite estar cerca de sustancias químicas o de  aquellas cosas que puedan interferir en la respiración, como vapores de pinturas y polvo. °· Descanse todo lo que sea necesario. Retome sus actividades habituales gradualmente. °· Si le indicaron medicamentos, tómelos como se los han prescrito durante todo el tiempo indicado. Estos incluyen el oxígeno y los medicamentos inhalados. °· Cumpla con todas las visitas de control, según le indique su médico. °SOLICITE ATENCIÓN MÉDICA SI:  °· Su afección no mejora en el tiempo esperado. °· Le cuesta hacer las actividades cotidianas, aun si ha descansado lo suficiente. °· Aparece algún síntoma nuevo. °SOLICITE ATENCIÓN MÉDICA DE INMEDIATO SI:  °· La falta de aire empeora. °· Se siente aturdido, se desmaya o tiene tos que no controla con medicamentos. °· Elimina sangre al toser. °· Siente dolor al respirar. °· Tiene dolor de pecho o en los brazos, hombros o abdomen. °· Tiene fiebre. °· No logra subir escaleras o realizar ejercicio del modo en que lo hacía habitualmente. °ASEGÚRESE DE QUE: °· Comprende estas instrucciones. °· Controlará su afección. °· Recibirá ayuda de inmediato si no mejora o si empeora. °Document Released: 12/26/2004 Document Revised: 03/23/2013 °ExitCare® Patient Information ©2015 [employer]. This information is not intended to replace advice given to you by your health care provider. Make sure you discuss any questions you have with your health care provider. ° °

## 2014-11-20 NOTE — ED Notes (Signed)
Pt c/o SOB and L chest pain x2 weeks. Reports coughing up blood.

## 2014-11-22 ENCOUNTER — Encounter: Payer: Self-pay | Admitting: Internal Medicine

## 2014-11-22 ENCOUNTER — Ambulatory Visit (INDEPENDENT_AMBULATORY_CARE_PROVIDER_SITE_OTHER): Payer: Self-pay | Admitting: Internal Medicine

## 2014-11-22 VITALS — BP 112/70 | HR 51 | Ht 66.0 in | Wt 184.0 lb

## 2014-11-22 DIAGNOSIS — R0600 Dyspnea, unspecified: Secondary | ICD-10-CM | POA: Insufficient documentation

## 2014-11-22 MED ORDER — OMEPRAZOLE MAGNESIUM 20 MG PO TBEC: 20.0000 mg | DELAYED_RELEASE_TABLET | Freq: Every day | ORAL | Status: DC

## 2014-11-22 MED ORDER — FAMOTIDINE 20 MG PO TABS: ORAL_TABLET | ORAL | Status: DC

## 2014-11-22 NOTE — Progress Notes (Signed)
Subjective:    Patient ID: Brandon Ward, male    DOB: 1975-09-28,    MRN: [id number]  HPI  39 yo [place]oracan male quit smoking 07/2014 with onset of doe x soccer  Then sob at rest s pattern > neg w/u at  [facility] ER x 2 and [facility] 11/20/14 with neg cards w/u in [place] but referred to pulmonary clinic 11/22/2014 by EDP = [place]   . 11/22/2014 1st [facility] office visit/ [facility]   Chief Complaint  Patient presents with  . Pulmonary Consult    Self referral. Pt c/o SOB for the past 8 months- started when he was playing soccer. He states SOB has been worse for the past 2 wks and occurs with or without exertion. He also c/o chest tightness.   despite asking the question multiple ways with friend helping the communication>>  it does not appear the problem is reproducible or predictable ie comes and goes withouht pattern sitting/ supine/ walking resolves within a few min with or without saba but has not been able to go back to playing soccer which is what he first noted caused the problem.   No obvious other patterns in day to day or daytime variabilty or assoc chronic cough or classically ex or pleuritic cp,   subjective wheeze overt sinus or hb symptoms. No unusual exp hx or h/o childhood pna/ asthma or knowledge of premature birth.  Sleeping ok without nocturnal  or early am exacerbation  of respiratory  c/o's or need for noct saba. Also denies any obvious fluctuation of symptoms with weather or environmental changes or other aggravating or alleviating factors except as outlined above   Current Medications, Allergies, Complete Past Medical History, Past Surgical History, Family History, and Social History were reviewed in [employer] record.            Review of Systems  Constitutional: Negative for fever, chills, activity change, appetite change and unexpected weight change.  HENT: Negative for congestion, dental problem, postnasal drip, rhinorrhea, sneezing, sore throat,  trouble swallowing and voice change.   Eyes: Negative for visual disturbance.  Respiratory: Positive for shortness of breath. Negative for cough and choking.   Cardiovascular: Negative for chest pain and leg swelling.  Gastrointestinal: Negative for nausea, vomiting and abdominal pain.  Genitourinary: Negative for difficulty urinating.  Musculoskeletal: Negative for arthralgias.  Skin: Negative for rash.  Psychiatric/Behavioral: Negative for behavioral problems and confusion.       Objective:   Physical Exam  amb arabic male/ very difficult hx  Wt Readings from Last 3 Encounters:  11/22/14 184 lb (83.462 kg)  09/03/12 189 lb (85.73 kg)    Vital signs reviewed  HEENT: nl dentition, turbinates, and orophanx. Nl external ear canals without cough reflex   NECK :  without JVD/Nodes/TM/ nl carotid upstrokes bilaterally   LUNGS: no acc muscle use, clear to A and P bilaterally without cough on insp or exp maneuvers   CV:  RRR  no s3 or murmur or increase in P2, no edema   ABD:  soft and nontender with nl excursion in the supine position. No bruits or organomegaly, bowel sounds nl  MS:  warm without deformities, calf tenderness, cyanosis or clubbing  SKIN: warm and dry without lesions    NEURO:  alert, approp, no deficits      I personally reviewed images and agree with radiology impression as follows:  CXR:  11/20/14 Cardiomediastinal silhouette unremarkable, unchanged. Lungs clear. Bronchovascular markings normal.  Pulmonary vascularity normal. No visible pleural effusions. No pneumothorax. Visualized bony thorax intact. No significant interval change.     Labs ordered/ reviewed:    Lab 11/20/14 1500  NA 139  K 3.9  CL 107  CO2 23  BUN 15  CREATININE 1.00  GLUCOSE 91     Lab 11/20/14 1500  HGB 15.4  HCT 43.8  WBC 6.1  PLT 268     Lab Results  Component Value Date   DDIMER <0.27 11/20/2014        Assessment & Plan:

## 2014-11-22 NOTE — Assessment & Plan Note (Signed)
11/22/2014  Walked RA x 3 laps @ 185 ft each stopped due to  End of study no sob  - spirometry 11/22/2014 > wnl including fef 25-75   Symptoms are markedly disproportionate to objective findings and not clear this is a lung problem but pt does appear to have difficult airway management issues. DDX of  difficult airways management all start with A and  include Adherence, Ace Inhibitors, Acid Reflux, Active Sinus Disease, Alpha 1 Antitripsin deficiency, Anxiety masquerading as Airways dz,  ABPA,  allergy(esp in young), Aspiration (esp in elderly), Adverse effects of meds,  Active smokers, A bunch of PE's (a small clot burden can't cause this syndrome unless there is already severe underlying pulm or vascular dz with poor reserve) plus two Bs  = Bronchiectasis and Beta blocker use..and one C= CHF  ? Acid (or non-acid) GERD > always difficult to exclude as up to 75% of pts in some series report no assoc GI/ Heartburn symptoms> rec max (24h)  acid suppression and diet restrictions/ reviewed and instructions given in writing.   ? Anxiety > dx of exclusion but higher here since symptoms not reproducible at all   ? Active smoking > actually worse since stopped so much more likely anxiety related   ? A bunch of PE >  D dimer nl - while  A nl valute  may miss small peripheral pe, the clot burden with sob is moderately high and the d dimer has a very high neg pred value in this setting     ? chf > neg w/u by cards in [place]  I had an extended discussion with the patient reviewing all relevant studies completed to date and  lasting 30 m of a 45 m ov    Each maintenance medication was reviewed in detail including most importantly the difference between maintenance and prns and under what circumstances the prns are to be triggered using an action plan format that is not reflected in the computer generated alphabetically organized AVS.    Please see instructions for details which were reviewed in writing and the  patient given a copy highlighting the part that I personally wrote and discussed at today's ov.

## 2014-11-22 NOTE — Patient Instructions (Signed)
Congratulations on not smoking > this is the most important aspect of your care  Try prilosec otc   Take 30-60 min before first meal of the day and Pepcid ac (famotidine) 20 mg one @  bedtime  X one month  GERD (REFLUX)  is an extremely common cause of respiratory symptoms just like yours , many times with no obvious heartburn at all.    It can be treated with medication, but also with lifestyle changes including elevation of the head of your bed (ideally with 6 inch  bed blocks),  Smoking cessation, avoidance of late meals, excessive alcohol, and avoid fatty foods, chocolate, peppermint, colas, red wine, and acidic juices such as orange juice.  NO MINT OR MENTHOL PRODUCTS SO NO COUGH DROPS  USE SUGARLESS CANDY INSTEAD (Jolley ranchers or Stover's or Life Savers) or even ice chips will also do - the key is to swallow to prevent all throat clearing. NO OIL BASED VITAMINS - use powdered substitutes.    Follow up with primary care doctor in [place] if you want to say in the [facility] system

## 2015-02-14 ENCOUNTER — Emergency Department (HOSPITAL_COMMUNITY)
Admission: EM | Admit: 2015-02-14 | Discharge: 2015-02-14 | Discharge status: Against Medical Advice | Payer: Self-pay | Attending: Emergency Medicine | Admitting: Emergency Medicine

## 2015-02-14 ENCOUNTER — Encounter (HOSPITAL_COMMUNITY): Payer: Self-pay | Admitting: Nurse Practitioner

## 2015-02-14 DIAGNOSIS — K0889 Other specified disorders of teeth and supporting structures: Secondary | ICD-10-CM | POA: Insufficient documentation

## 2015-02-14 NOTE — ED Notes (Signed)
Pt c/o dental pain, obvious inflammation noted on the right side. Denies fevers, chills or obvious signs of infection.

## 2016-05-25 ENCOUNTER — Encounter (HOSPITAL_COMMUNITY): Payer: Self-pay | Admitting: Emergency Medicine

## 2016-05-25 ENCOUNTER — Emergency Department (HOSPITAL_COMMUNITY)
Admission: EM | Admit: 2016-05-25 | Discharge: 2016-05-26 | Disposition: A | Discharge status: Home / Self Care | Payer: Self-pay | Attending: Emergency Medicine | Admitting: Emergency Medicine

## 2016-05-25 ENCOUNTER — Emergency Department (HOSPITAL_COMMUNITY): Payer: Self-pay

## 2016-05-25 DIAGNOSIS — Z87891 Personal history of nicotine dependence: Secondary | ICD-10-CM | POA: Insufficient documentation

## 2016-05-25 DIAGNOSIS — Z79899 Other long term (current) drug therapy: Secondary | ICD-10-CM | POA: Insufficient documentation

## 2016-05-25 DIAGNOSIS — R0600 Dyspnea, unspecified: Secondary | ICD-10-CM | POA: Insufficient documentation

## 2016-05-25 MED ORDER — ALBUTEROL SULFATE HFA 108 (90 BASE) MCG/ACT IN AERS: 2.0000 | INHALATION_SPRAY | RESPIRATORY_TRACT | Status: DC | PRN

## 2016-05-25 MED ORDER — AEROCHAMBER PLUS FLO-VU MEDIUM MISC: 1.0000 | Freq: Once | Status: DC

## 2016-05-25 MED ORDER — ALBUTEROL SULFATE (2.5 MG/3ML) 0.083% IN NEBU
5.0000 mg | INHALATION_SOLUTION | Freq: Once | RESPIRATORY_TRACT | Status: AC
  Administered 2016-05-25: 5 mg via RESPIRATORY_TRACT
  Filled 2016-05-25: qty 6

## 2016-05-25 NOTE — Discharge Instructions (Signed)
Return here as needed. Follow up with the cardiologist provided.  °

## 2016-05-25 NOTE — ED Triage Notes (Signed)
Patient complaining of having trouble breathing while running. Patient states it has not gotten any better. Patient states he feels a knot in between his ribs.

## 2016-05-25 NOTE — ED Provider Notes (Signed)
[facility]-EMERGENCY DEPT Provider Note   CSN: [id number] Arrival date & time: 05/25/16  1944     History   Chief Complaint Chief Complaint  Patient presents with  . Shortness of Breath    HPI Brandon Ward is a 41 y.o. male.  HPI Patient presents to the emergency department with a 2 year history of shortness of breath, mainly associated with exercise.  The patient states he feels like he has a knot in the center of his chest.  When exercising.  He feels like he has to take deep breaths.  Patient states that nothing seems make the condition better or worse.  The patient states that from time to time, we will have it happen when he is doing work or sitting and typing on the computer.  Patient states that he does have a primary doctor and states that he has seen them for this. The patient denies chest pain,  headache,blurred vision, neck pain, fever, cough, weakness, numbness, dizziness, anorexia, edema, abdominal pain, nausea, vomiting, diarrhea, rash, back pain, dysuria, hematemesis, bloody stool, near syncope, or syncope. History reviewed. No pertinent past medical history.  Patient Active Problem List   Diagnosis Date Noted  . Dyspnea 11/22/2014    History reviewed. No pertinent surgical history.     Home Medications    Prior to Admission medications   Medication Sig Start Date End Date Taking? Authorizing Provider  albuterol (PROVENTIL HFA;VENTOLIN HFA) 108 (90 BASE) MCG/ACT inhaler Inhale 1-2 puffs into the lungs every 6 (six) hours as needed for wheezing or shortness of breath.    Historical Provider, MD  ALPRAZolam Prudy Feeler(XANAX) 0.5 MG tablet Take 0.5 mg by mouth at bedtime as needed for anxiety or sleep.    Historical Provider, MD  famotidine (PEPCID) 20 MG tablet One at bedtime 11/22/14   Nyoka CowdenMichael B Wert, MD  omeprazole (PRILOSEC OTC) 20 MG tablet Take 1 tablet (20 mg total) by mouth daily. 11/22/14   Nyoka CowdenMichael B Wert, MD    Family History History reviewed. No pertinent family  history.  Social History Social History  Substance Use Topics  . Smoking status: Former Smoker    Packs/day: 1.50    Years: 18.00    Types: Cigarettes    Quit date: 07/31/2014  . Smokeless tobacco: Never Used  . Alcohol use 0.0 oz/week     Comment: sometimes     Allergies   Pain & fever [acetaminophen]   Review of Systems Review of Systems All other systems negative except as documented in the HPI. All pertinent positives and negatives as reviewed in the HPI.  Physical Exam Updated Vital Signs BP 126/65   Pulse (!) 50   Temp 98.9 F (37.2 C) (Oral)   Resp 11   Ht 5\' 8"  (1.727 m)   Wt 80.7 kg   SpO2 100%   BMI 27.06 kg/m   Physical Exam  Constitutional: He is oriented to person, place, and time. He appears well-developed and well-nourished. No distress.  HENT:  Head: Normocephalic and atraumatic.  Mouth/Throat: Oropharynx is clear and moist.  Eyes: Pupils are equal, round, and reactive to light.  Neck: Normal range of motion. Neck supple.  Cardiovascular: Normal rate, regular rhythm and normal heart sounds.  Exam reveals no gallop and no friction rub.   No murmur heard. Pulmonary/Chest: Effort normal and breath sounds normal. No respiratory distress. He has no wheezes.  Neurological: He is alert and oriented to person, place, and time. He exhibits normal muscle tone. Coordination normal.  Skin: Skin is warm and dry. Capillary refill takes less than 2 seconds. No rash noted. No erythema.  Psychiatric: He has a normal mood and affect. His behavior is normal.  Nursing note and vitals reviewed.    ED Treatments / Results  Labs (all labs ordered are listed, but only abnormal results are displayed) Labs Reviewed - No data to display  EKG  EKG Interpretation None       Radiology Dg Chest 2 View  Result Date: 05/25/2016 CLINICAL DATA:  Trouble breathing while running recently. Knot between her ribs. EXAM: CHEST  2 VIEW COMPARISON:  11/20/2014 FINDINGS: Lungs  are adequately inflated without focal airspace consolidation or effusion. Cardiomediastinal silhouette is within normal. Bones and soft tissues are normal. IMPRESSION: No active cardiopulmonary disease. Electronically Signed   By: Elberta Fortis M.D.   On: 05/25/2016 21:24    Procedures Procedures (including critical care time)  Medications Ordered in ED Medications  albuterol (PROVENTIL) (2.5 MG/3ML) 0.083% nebulizer solution 5 mg (not administered)     Initial Impression / Assessment and Plan / ED Course  I have reviewed the triage vital signs and the nursing notes.  Pertinent labs & imaging results that were available during my care of the patient were reviewed by me and considered in my medical decision making (see chart for details).     Patient be given inhaler and cardiology referral.  Patient is advised plan and all questions were answered.  He does not have any acute issues here in the emergency department.  His vital signs have been stable  Final Clinical Impressions(s) / ED Diagnoses   Final diagnoses:  None    New Prescriptions New Prescriptions   No medications on file     Charlestine Night, PA-C 05/26/16 0104    Mancel Bale, MD 05/26/16 [phone]

## 2017-05-30 ENCOUNTER — Encounter (HOSPITAL_COMMUNITY): Payer: Self-pay

## 2017-05-30 DIAGNOSIS — Z87891 Personal history of nicotine dependence: Secondary | ICD-10-CM | POA: Diagnosis not present

## 2017-05-30 DIAGNOSIS — R1013 Epigastric pain: Secondary | ICD-10-CM | POA: Insufficient documentation

## 2017-05-30 DIAGNOSIS — Z79899 Other long term (current) drug therapy: Secondary | ICD-10-CM | POA: Insufficient documentation

## 2017-05-30 LAB — CBC
HEMATOCRIT: 42.4 % (ref 39.0–52.0)
HEMOGLOBIN: 14.4 g/dL (ref 13.0–17.0)
MCH: 30.1 pg (ref 26.0–34.0)
MCHC: 34 g/dL (ref 30.0–36.0)
MCV: 88.7 fL (ref 78.0–100.0)
Platelets: 244 10*3/uL (ref 150–400)
RBC: 4.78 MIL/uL (ref 4.22–5.81)
RDW: 12.9 % (ref 11.5–15.5)
WBC: 6 10*3/uL (ref 4.0–10.5)

## 2017-05-30 LAB — LIPASE, BLOOD: Lipase: 26 U/L (ref 11–51)

## 2017-05-30 LAB — COMPREHENSIVE METABOLIC PANEL
ALBUMIN: 4.4 g/dL (ref 3.5–5.0)
ALK PHOS: 106 U/L (ref 38–126)
ALT: 26 U/L (ref 17–63)
ANION GAP: 10 (ref 5–15)
AST: 21 U/L (ref 15–41)
BUN: 22 mg/dL — AB (ref 6–20)
CALCIUM: 9.4 mg/dL (ref 8.9–10.3)
CO2: 23 mmol/L (ref 22–32)
Chloride: 108 mmol/L (ref 101–111)
Creatinine, Ser: 1.01 mg/dL (ref 0.61–1.24)
GFR calc Af Amer: >60 mL/min (ref >60)
GFR calc non Af Amer: >60 mL/min (ref >60)
GLUCOSE: 124 mg/dL — AB (ref 65–99)
Potassium: 3.8 mmol/L (ref 3.5–5.1)
SODIUM: 141 mmol/L (ref 135–145)
Total Bilirubin: 0.6 mg/dL (ref 0.3–1.2)
Total Protein: 7.4 g/dL (ref 6.5–8.1)

## 2017-05-30 NOTE — ED Triage Notes (Signed)
Pt complains of abdominal distention and epigastric pain every time he eats something

## 2017-05-31 ENCOUNTER — Emergency Department (HOSPITAL_COMMUNITY)
Admission: EM | Admit: 2017-05-31 | Discharge: 2017-05-31 | Disposition: A | Discharge status: Home / Self Care | Payer: BLUE CROSS/BLUE SHIELD | Attending: Emergency Medicine | Admitting: Emergency Medicine

## 2017-05-31 DIAGNOSIS — R1013 Epigastric pain: Secondary | ICD-10-CM

## 2017-05-31 MED ORDER — ESOMEPRAZOLE MAGNESIUM 40 MG PO CPDR: 40.0000 mg | DELAYED_RELEASE_CAPSULE | Freq: Every day | ORAL | 0 refills | Status: DC

## 2017-05-31 MED ORDER — SUCRALFATE 1 G PO TABS: 1.0000 g | ORAL_TABLET | Freq: Three times a day (TID) | ORAL | 0 refills | Status: DC

## 2017-05-31 NOTE — ED Notes (Addendum)
Pt aware urine sample is needed 

## 2017-05-31 NOTE — Discharge Instructions (Addendum)
Stop taking Omeprazole and Pepcid.  Start Carafate and Nexium.  Please limit coffee, tea, alcohol, and smoking.  Do not uses NSAIDs such as ibuprofen.

## 2017-05-31 NOTE — ED Provider Notes (Signed)
[facility]-EMERGENCY DEPT Provider Note   CSN: [id number] Arrival date & time: 05/30/17  2128     History   Chief Complaint Chief Complaint  Patient presents with  . Abdominal Pain    HPI Brandon Ward is a 42 y.o. male.  Patient presents to the emergency department with a chief complaint of postprandial pain times 1 year.  He states that he has been taking Pepcid and omeprazole with no relief.  He states that he has the pain anytime he eats.  He denies any fever, chills, or vomiting.  He has not been seen by GI.  He denies any lower abdominal pain.  He states that he drinks a large amount of coffee and tea.  He does not smoke or use alcohol.  Denies frequent NSAID use.   The history is provided by the patient. No language interpreter was used.    History reviewed. No pertinent past medical history.  Patient Active Problem List   Diagnosis Date Noted  . Dyspnea 11/22/2014    History reviewed. No pertinent surgical history.     Home Medications    Prior to Admission medications   Medication Sig Start Date End Date Taking? Authorizing Provider  albuterol (PROVENTIL HFA;VENTOLIN HFA) 108 (90 BASE) MCG/ACT inhaler Inhale 1-2 puffs into the lungs every 6 (six) hours as needed for wheezing or shortness of breath.    [provider]  ALPRAZolam Prudy Feeler(XANAX) 0.5 MG tablet Take 0.5 mg by mouth at bedtime as needed for anxiety or sleep.    [provider]  famotidine (PEPCID) 20 MG tablet One at bedtime 11/22/14   Nyoka CowdenWert, Michael B, MD  omeprazole (PRILOSEC OTC) 20 MG tablet Take 1 tablet (20 mg total) by mouth daily. 11/22/14   Nyoka CowdenWert, Michael B, MD    Family History History reviewed. No pertinent family history.  Social History Social History   Tobacco Use  . Smoking status: Former Smoker    Packs/day: 1.50    Years: 18.00    Pack years: 27.00    Types: Cigarettes    Last attempt to quit: 07/31/2014    Years since quitting: 2.8  . Smokeless  tobacco: Never Used  Substance Use Topics  . Alcohol use: Yes    Alcohol/week: 0.0 oz    Comment: sometimes  . Drug use: No     Allergies   Pain & fever [acetaminophen]   Review of Systems Review of Systems  All other systems reviewed and are negative.    Physical Exam Updated Vital Signs BP 138/86 (BP Location: Right Arm)   Pulse 63   Temp 97.6 F (36.4 C) (Oral)   Resp 17   SpO2 100%   Physical Exam  Constitutional: He is oriented to person, place, and time. He appears well-developed and well-nourished.  HENT:  Head: Normocephalic and atraumatic.  Eyes: Conjunctivae and EOM are normal. Pupils are equal, round, and reactive to light. Right eye exhibits no discharge. Left eye exhibits no discharge. No scleral icterus.  Neck: Normal range of motion. Neck supple. No JVD present.  Cardiovascular: Normal rate, regular rhythm and normal heart sounds. Exam reveals no gallop and no friction rub.  No murmur heard. Pulmonary/Chest: Effort normal and breath sounds normal. No respiratory distress. He has no wheezes. He has no rales. He exhibits no tenderness.  Abdominal: Soft. He exhibits no distension and no mass. There is no tenderness. There is no rebound and no guarding.  No focal abdominal tenderness, no RLQ  tenderness or pain at McBurney's point, no RUQ tenderness or Murphy's sign, no left-sided abdominal tenderness, no fluid wave, or signs of peritonitis   Musculoskeletal: Normal range of motion. He exhibits no edema or tenderness.  Neurological: He is alert and oriented to person, place, and time.  Skin: Skin is warm and dry.  Psychiatric: He has a normal mood and affect. His behavior is normal. Judgment and thought content normal.  Nursing note and vitals reviewed.    ED Treatments / Results  Labs (all labs ordered are listed, but only abnormal results are displayed) Labs Reviewed  COMPREHENSIVE METABOLIC PANEL - Abnormal; Notable for the following components:       Result Value   Glucose, Bld 124 (*)    BUN 22 (*)    All other components within normal limits  LIPASE, BLOOD  CBC    EKG  EKG Interpretation None       Radiology No results found.  Procedures Procedures (including critical care time)  Medications Ordered in ED Medications - No data to display   Initial Impression / Assessment and Plan / ED Course  I have reviewed the triage vital signs and the nursing notes.  Pertinent labs & imaging results that were available during my care of the patient were reviewed by me and considered in my medical decision making (see chart for details).     Patient with postprandial pain times 1 year.  He drinks large amounts of coffee and tea.  He does not smoke or use alcohol.  He also denies NSAID use.  Will treat with Nexium and Carafate.  Recommend follow-up with GI.  Vital signs are stable.  Laboratory workup is reassuring.  Lipase is normal.  LFTs are normal.  Doubt pancreatitis or cholecystitis.  No focal abdominal tenderness.  Patient is stable for outpatient workup.  Final Clinical Impressions(s) / ED Diagnoses   Final diagnoses:  Epigastric pain    ED Discharge Orders        Ordered    sucralfate (CARAFATE) 1 g tablet  3 times daily with meals & bedtime     05/31/17 0056    esomeprazole (NEXIUM) 40 MG capsule  Daily     05/31/17 0056       Roxy Horseman, PA-C 05/31/17 0059    Linwood Dibbles, MD 05/31/17 [phone]

## 2017-06-17 ENCOUNTER — Emergency Department (HOSPITAL_COMMUNITY)
Admission: EM | Admit: 2017-06-17 | Discharge: 2017-06-18 | Disposition: A | Discharge status: Home / Self Care | Payer: BLUE CROSS/BLUE SHIELD | Attending: Emergency Medicine | Admitting: Emergency Medicine

## 2017-06-17 ENCOUNTER — Other Ambulatory Visit: Payer: Self-pay

## 2017-06-17 ENCOUNTER — Encounter (HOSPITAL_COMMUNITY): Payer: Self-pay | Admitting: Emergency Medicine

## 2017-06-17 DIAGNOSIS — R1011 Right upper quadrant pain: Secondary | ICD-10-CM | POA: Diagnosis not present

## 2017-06-17 DIAGNOSIS — Z87891 Personal history of nicotine dependence: Secondary | ICD-10-CM | POA: Insufficient documentation

## 2017-06-17 DIAGNOSIS — R1010 Upper abdominal pain, unspecified: Secondary | ICD-10-CM

## 2017-06-17 NOTE — ED Notes (Signed)
PT HAS URINE SAMPLE IN TRIAGE IF NEEDED 

## 2017-06-17 NOTE — ED Triage Notes (Signed)
Pt states he has a problem with his stomach  Pt states when he eats he gets bloated and feels like he has a hard time breathing  Pt was seen here for same about 2 weeks ago and was given some medications   Pt states it helps some but he is still having problems

## 2017-06-18 ENCOUNTER — Encounter (HOSPITAL_COMMUNITY): Payer: Self-pay | Admitting: Emergency Medicine

## 2017-06-18 ENCOUNTER — Encounter: Payer: Self-pay | Admitting: Physician Assistant

## 2017-06-18 ENCOUNTER — Emergency Department (HOSPITAL_COMMUNITY): Payer: BLUE CROSS/BLUE SHIELD

## 2017-06-18 LAB — COMPREHENSIVE METABOLIC PANEL
ALBUMIN: 4 g/dL (ref 3.5–5.0)
ALT: 33 U/L (ref 17–63)
ANION GAP: 9 (ref 5–15)
AST: 21 U/L (ref 15–41)
Alkaline Phosphatase: 98 U/L (ref 38–126)
BUN: 14 mg/dL (ref 6–20)
CO2: 22 mmol/L (ref 22–32)
Calcium: 9.1 mg/dL (ref 8.9–10.3)
Chloride: 108 mmol/L (ref 101–111)
Creatinine, Ser: 1.13 mg/dL (ref 0.61–1.24)
GFR calc Af Amer: >60 mL/min (ref >60)
GFR calc non Af Amer: >60 mL/min (ref >60)
GLUCOSE: 111 mg/dL — AB (ref 65–99)
POTASSIUM: 3.9 mmol/L (ref 3.5–5.1)
SODIUM: 139 mmol/L (ref 135–145)
Total Bilirubin: 0.5 mg/dL (ref 0.3–1.2)
Total Protein: 7 g/dL (ref 6.5–8.1)

## 2017-06-18 LAB — CBC WITH DIFFERENTIAL/PLATELET
BASOS ABS: 0 10*3/uL (ref 0.0–0.1)
BASOS PCT: 0 %
EOS ABS: 0.1 10*3/uL (ref 0.0–0.7)
Eosinophils Relative: 2 %
HCT: 40.9 % (ref 39.0–52.0)
HEMOGLOBIN: 14.2 g/dL (ref 13.0–17.0)
Lymphocytes Relative: 35 %
Lymphs Abs: 2.3 10*3/uL (ref 0.7–4.0)
MCH: 30.7 pg (ref 26.0–34.0)
MCHC: 34.7 g/dL (ref 30.0–36.0)
MCV: 88.3 fL (ref 78.0–100.0)
MONO ABS: 0.4 10*3/uL (ref 0.1–1.0)
MONOS PCT: 6 %
NEUTROS PCT: 57 %
Neutro Abs: 3.8 10*3/uL (ref 1.7–7.7)
Platelets: 266 10*3/uL (ref 150–400)
RBC: 4.63 MIL/uL (ref 4.22–5.81)
RDW: 13 % (ref 11.5–15.5)
WBC: 6.6 10*3/uL (ref 4.0–10.5)

## 2017-06-18 LAB — LIPASE, BLOOD: Lipase: 29 U/L (ref 11–51)

## 2017-06-18 MED ORDER — DICYCLOMINE HCL 20 MG PO TABS: 20.0000 mg | ORAL_TABLET | Freq: Two times a day (BID) | ORAL | 0 refills | Status: DC

## 2017-06-18 MED ORDER — GI COCKTAIL ~~LOC~~
30.0000 mL | Freq: Once | ORAL | Status: AC
  Administered 2017-06-18: 30 mL via ORAL
  Filled 2017-06-18: qty 30

## 2017-06-18 MED ORDER — DICYCLOMINE HCL 10 MG/ML IM SOLN
20.0000 mg | Freq: Once | INTRAMUSCULAR | Status: AC
  Administered 2017-06-18: 20 mg via INTRAMUSCULAR
  Filled 2017-06-18: qty 2

## 2017-06-18 NOTE — ED Notes (Signed)
Pt has two gold tops in lab if needed.

## 2017-06-18 NOTE — ED Provider Notes (Signed)
[facility]-EMERGENCY DEPT Provider Note   CSN: [id number] Arrival date & time: 06/17/17  2030     History   Chief Complaint Chief Complaint  Patient presents with  . Abdominal Pain    HPI Brandon Ward is a 42 y.o. male.  The history is provided by the patient.  Abdominal Pain   This is a chronic problem. The current episode started more than 1 week ago (at least 3 months). The problem occurs constantly. The problem has not changed since onset.The pain is associated with an unknown factor. The pain is located in the RUQ and epigastric region. Quality: bloating. The pain is moderate. Pertinent negatives include anorexia, fever, belching, diarrhea, flatus, hematochezia, melena, nausea, vomiting, constipation, dysuria, frequency, hematuria, headaches, arthralgias and myalgias. Nothing aggravates the symptoms. Nothing relieves the symptoms. Past workup does not include GI consult. His past medical history does not include PUD.    History reviewed. No pertinent past medical history.  Patient Active Problem List   Diagnosis Date Noted  . Dyspnea 11/22/2014    History reviewed. No pertinent surgical history.     Home Medications    Prior to Admission medications   Medication Sig Start Date End Date Taking? Authorizing Provider  esomeprazole (NEXIUM) 40 MG capsule Take 1 capsule (40 mg total) by mouth daily. Patient not taking: Reported on 06/17/2017 05/31/17   Roxy Horseman, PA-C  sucralfate (CARAFATE) 1 g tablet Take 1 tablet (1 g total) by mouth 4 (four) times daily -  with meals and at bedtime. Patient not taking: Reported on 06/17/2017 05/31/17   Roxy Horseman, PA-C    Family History History reviewed. No pertinent family history.  Social History Social History   Tobacco Use  . Smoking status: Former Smoker    Packs/day: 1.50    Years: 18.00    Pack years: 27.00    Types: Cigarettes    Last attempt to quit: 07/31/2014    Years since quitting:  2.8  . Smokeless tobacco: Never Used  Substance Use Topics  . Alcohol use: Yes    Alcohol/week: 0.0 oz    Comment: sometimes  . Drug use: No     Allergies   Pain & fever [acetaminophen]   Review of Systems Review of Systems  Constitutional: Negative for fever.  Respiratory: Negative for shortness of breath.   Cardiovascular: Negative for chest pain.  Gastrointestinal: Positive for abdominal pain. Negative for anorexia, constipation, diarrhea, flatus, hematochezia, melena, nausea and vomiting.  Genitourinary: Negative for dysuria, frequency and hematuria.  Musculoskeletal: Negative for arthralgias and myalgias.  Neurological: Negative for headaches.  All other systems reviewed and are negative.    Physical Exam Updated Vital Signs BP 113/65 (BP Location: Left Arm)   Pulse (!) 52   Temp 98 F (36.7 C) (Oral)   Resp 14   Ht 5\' 7"  (1.702 m)   Wt 84.9 kg (187 lb 3.2 oz)   SpO2 97%   BMI 29.32 kg/m   Physical Exam  Constitutional: He is oriented to person, place, and time. He appears well-developed and well-nourished. No distress.  HENT:  Head: Normocephalic and atraumatic.  Mouth/Throat: No oropharyngeal exudate.  Eyes: Conjunctivae are normal. Pupils are equal, round, and reactive to light.  Neck: Normal range of motion. Neck supple.  Cardiovascular: Normal rate, regular rhythm, normal heart sounds and intact distal pulses.  Pulmonary/Chest: Effort normal and breath sounds normal. No stridor. He has no wheezes. He has no rales.  Abdominal: Soft. He exhibits  no mass. There is no tenderness. There is no rebound and no guarding.  gassy  Musculoskeletal: Normal range of motion.  Neurological: He is alert and oriented to person, place, and time.  Skin: Skin is warm and dry. Capillary refill takes less than 2 seconds.     ED Treatments / Results  Labs (all labs ordered are listed, but only abnormal results are displayed)  Results for orders placed or performed  during the hospital encounter of 06/17/17  CBC with Differential/Platelet  Result Value Ref Range   WBC 6.6 4.0 - 10.5 K/uL   RBC 4.63 4.22 - 5.81 MIL/uL   Hemoglobin 14.2 13.0 - 17.0 g/dL   HCT 21.3 08.6 - 57.8 %   MCV 88.3 78.0 - 100.0 fL   MCH 30.7 26.0 - 34.0 pg   MCHC 34.7 30.0 - 36.0 g/dL   RDW 46.9 62.9 - 52.8 %   Platelets 266 150 - 400 K/uL   Neutrophils Relative % 57 %   Neutro Abs 3.8 1.7 - 7.7 K/uL   Lymphocytes Relative 35 %   Lymphs Abs 2.3 0.7 - 4.0 K/uL   Monocytes Relative 6 %   Monocytes Absolute 0.4 0.1 - 1.0 K/uL   Eosinophils Relative 2 %   Eosinophils Absolute 0.1 0.0 - 0.7 K/uL   Basophils Relative 0 %   Basophils Absolute 0.0 0.0 - 0.1 K/uL  Comprehensive metabolic panel  Result Value Ref Range   Sodium 139 135 - 145 mmol/L   Potassium 3.9 3.5 - 5.1 mmol/L   Chloride 108 101 - 111 mmol/L   CO2 22 22 - 32 mmol/L   Glucose, Bld 111 (H) 65 - 99 mg/dL   BUN 14 6 - 20 mg/dL   Creatinine, Ser 4.13 0.61 - 1.24 mg/dL   Calcium 9.1 8.9 - 24.4 mg/dL   Total Protein 7.0 6.5 - 8.1 g/dL   Albumin 4.0 3.5 - 5.0 g/dL   AST 21 15 - 41 U/L   ALT 33 17 - 63 U/L   Alkaline Phosphatase 98 38 - 126 U/L   Total Bilirubin 0.5 0.3 - 1.2 mg/dL   GFR calc non Af Amer >60 >60 mL/min   GFR calc Af Amer >60 >60 mL/min   Anion gap 9 5 - 15  Lipase, blood  Result Value Ref Range   Lipase 29 11 - 51 U/L   US Abdomen Complete  Result Date: 06/18/2017 CLINICAL DATA:  Abdominal bloating. Worst after eating. Symptoms for 3 years. EXAM: ABDOMEN ULTRASOUND COMPLETE COMPARISON:  None. FINDINGS: Gallbladder: Physiologically distended. No gallstones or wall thickening visualized. No sonographic Murphy sign noted by sonographer. Common bile duct: Diameter: 3 mm Liver: No focal lesion identified. Mild increased parenchymal echogenicity diffusely. Portal vein is patent on color Doppler imaging with normal direction of blood flow towards the liver. IVC: No abnormality visualized. Pancreas:  Not visualized due to bowel gas. Spleen: Size and appearance within normal limits. Right Kidney: Length: 10 cm. Echogenicity within normal limits. No mass or hydronephrosis visualized. Left Kidney: Length: 10.4 cm. Echogenicity within normal limits. No mass or hydronephrosis visualized. Abdominal aorta: Obscured by bowel gas. Other findings: No ascites. IMPRESSION: 1. Hepatic steatosis. 2. Otherwise unremarkable abdominal ultrasound allowing for nonvisualization of the midline structures including the pancreas and aorta. Electronically Signed   By: Rubye Oaks M.D.   On: 06/18/2017 02:18    Radiology US Abdomen Complete  Result Date: 06/18/2017 CLINICAL DATA:  Abdominal bloating. Worst after eating. Symptoms for  3 years. EXAM: ABDOMEN ULTRASOUND COMPLETE COMPARISON:  None. FINDINGS: Gallbladder: Physiologically distended. No gallstones or wall thickening visualized. No sonographic Murphy sign noted by sonographer. Common bile duct: Diameter: 3 mm Liver: No focal lesion identified. Mild increased parenchymal echogenicity diffusely. Portal vein is patent on color Doppler imaging with normal direction of blood flow towards the liver. IVC: No abnormality visualized. Pancreas: Not visualized due to bowel gas. Spleen: Size and appearance within normal limits. Right Kidney: Length: 10 cm. Echogenicity within normal limits. No mass or hydronephrosis visualized. Left Kidney: Length: 10.4 cm. Echogenicity within normal limits. No mass or hydronephrosis visualized. Abdominal aorta: Obscured by bowel gas. Other findings: No ascites. IMPRESSION: 1. Hepatic steatosis. 2. Otherwise unremarkable abdominal ultrasound allowing for nonvisualization of the midline structures including the pancreas and aorta. Electronically Signed   By: Rubye OaksMelanie  Ehinger M.D.   On: 06/18/2017 02:18    Procedures Procedures (including critical care time)  Medications Ordered in ED Medications  dicyclomine (BENTYL) injection 20 mg (20 mg  Intramuscular Given 06/18/17 0056)  gi cocktail (Maalox,Lidocaine,Donnatal) (30 mLs Oral Given 06/18/17 0056)       Final Clinical Impressions(s) / ED Diagnoses   Patient has had an upper GI.  Given the chronic nature he will need to be seen by GI.  Continue carafate and NExium.  Will add bentyl and refer to GI.    Return for weakness, numbness, changes in vision or speech, fevers >100.4 unrelieved by medication, shortness of breath, intractable vomiting, or diarrhea, abdominal pain, Inability to tolerate liquids or food, cough, altered mental status or any concerns. No signs of systemic illness or infection. The patient is nontoxic-appearing on exam and vital signs are within normal limits.   I have reviewed the triage vital signs and the nursing notes. Pertinent labs &imaging results that were available during my care of the patient were reviewed by me and considered in my medical decision making (see chart for details).  After history, exam, and medical workup I feel the patient has been appropriately medically screened and is safe for discharge home. Pertinent diagnoses were discussed with the patient. Patient was given return precautions.   Torey Regan, MD 06/18/17 [phone]

## 2017-07-03 ENCOUNTER — Other Ambulatory Visit: Payer: BLUE CROSS/BLUE SHIELD

## 2017-07-03 ENCOUNTER — Ambulatory Visit (INDEPENDENT_AMBULATORY_CARE_PROVIDER_SITE_OTHER): Payer: BLUE CROSS/BLUE SHIELD | Admitting: Physician Assistant

## 2017-07-03 ENCOUNTER — Encounter (INDEPENDENT_AMBULATORY_CARE_PROVIDER_SITE_OTHER): Payer: Self-pay

## 2017-07-03 ENCOUNTER — Encounter: Payer: Self-pay | Admitting: Physician Assistant

## 2017-07-03 VITALS — BP 130/72 | HR 63 | Ht 67.72 in | Wt 181.0 lb

## 2017-07-03 DIAGNOSIS — R140 Abdominal distension (gaseous): Secondary | ICD-10-CM | POA: Diagnosis not present

## 2017-07-03 DIAGNOSIS — R1013 Epigastric pain: Secondary | ICD-10-CM | POA: Diagnosis not present

## 2017-07-03 MED ORDER — AMBULATORY NON FORMULARY MEDICATION: 2 refills | Status: DC

## 2017-07-03 NOTE — Patient Instructions (Addendum)
Please go to the basement level to have your labs drawn.  [facility] Phone [phone]. [facility].  You have been scheduled for an endoscopy. Please follow written instructions given to you at your visit today. If you use inhalers (even only as needed), please bring them with you on the day of your procedure. If you are age 43 or younger, your body mass index should be between 19-25. Your Body mass index is 27.75 kg/m. If this is out of the aformentioned range listed, please consider follow up with your Primary Care Provider.

## 2017-07-03 NOTE — Progress Notes (Signed)
Assessment and plans reviewed  

## 2017-07-03 NOTE — Progress Notes (Signed)
Subjective:    Patient ID: Brandon Ward, male    DOB: 1975-10-11, 42 y.o.   MRN: [id number]  HPI Brandon Ward is a 42 year old male, new to GI today referred by the emergency room after 2 recent ER visits on 05/31/2017 and 06/17/2017 both for abdominal pain. He underwent upper abdominal ultrasound on 06/18/2017 which showed a physiologically distended gallbladder, no gallstones or wall thickening common bile duct of 3 mm some mild increase echogenicity of the liver.  Labs were also done that day with a normal CBC, lipase 29, E met within normal limits and liver function studies within normal limits.  He had been given a trial of Carafate and Nexium with 1 of his emergency room visits but says that none of those medications have made any difference.  He does take occasional Alka-Seltzer. Patient says he has had symptoms over the past 3 years that have progressively worsened.  He says he is now having symptoms every time he eats.  He says he gets discomfort in his lower chest and upper abdomen and a bloated tight feeling which makes him very uncomfortable.  He has occasional heartburn but not on a regular basis.  He also endorses intermittent solid food dysphagia with a sense of food sitting in his chest.  There is no regurgitation.  This is happening infrequently.  His appetite has been okay weight has been stable.  He sometimes has postprandial urgency for bowel movements and may have 2 or 3 bowel movements after a meal of normal stool.  He has not noticed any blood.  He says sometimes with the bloated distended type of feelings uncomfortable with bending over and feels like he needs to stretch out or belch.  He does feel that excessive coffee will exacerbate his symptoms and sometimes meat. No regular aspirin or NSAIDs. No EtOH. No family history of GI disease that he is aware of. He is not on any regular medications Review of Systems Pertinent positive and negative review of systems were noted in the above HPI  section.  All other review of systems was otherwise negative.  Outpatient Encounter Medications as of 07/03/2017  Medication Sig  . Calcium Carbonate Antacid (ALKA-SELTZER ANTACID PO) Take by mouth.  . AMBULATORY NON FORMULARY MEDICATION Medication Name: GI Cocktail 90 ml-Lidocaine 2 % 90 ml- Dicyclomine 10 mg/5 ml 270 maalox Take 30 cc's by mouth with meals as needed.  . [DISCONTINUED] dicyclomine (BENTYL) 20 MG tablet Take 1 tablet (20 mg total) by mouth 2 (two) times daily.  . [DISCONTINUED] esomeprazole (NEXIUM) 40 MG capsule Take 1 capsule (40 mg total) by mouth daily. (Patient not taking: Reported on 06/17/2017)  . [DISCONTINUED] sucralfate (CARAFATE) 1 g tablet Take 1 tablet (1 g total) by mouth 4 (four) times daily -  with meals and at bedtime. (Patient not taking: Reported on 06/17/2017)   No facility-administered encounter medications on file as of 07/03/2017.    Allergies  Allergen Reactions  . Pain & Fever [Acetaminophen]     Pt allergic to a pain medicine- He does not remember which one. JF   Patient Active Problem List   Diagnosis Date Noted  . Dyspnea 11/22/2014   Social History   Socioeconomic History  . Marital status: Single    Spouse name: Not on file  . Number of children: Not on file  . Years of education: Not on file  . Highest education level: Not on file  Occupational History  . Not on file  Social Needs  .  Financial resource strain: Not on file  . Food insecurity:    Worry: Not on file    Inability: Not on file  . Transportation needs:    Medical: Not on file    Non-medical: Not on file  Tobacco Use  . Smoking status: Former Smoker    Packs/day: 1.50    Years: 18.00    Pack years: 27.00    Types: Cigarettes    Last attempt to quit: 07/31/2014    Years since quitting: 2.9  . Smokeless tobacco: Never Used  Substance and Sexual Activity  . Alcohol use: Yes    Alcohol/week: 0.0 oz    Comment: sometimes  . Drug use: No  . Sexual activity: Not on  file  Lifestyle  . Physical activity:    Days per week: Not on file    Minutes per session: Not on file  . Stress: Not on file  Relationships  . Social connections:    Talks on phone: Not on file    Gets together: Not on file    Attends religious service: Not on file    Active member of club or organization: Not on file    Attends meetings of clubs or organizations: Not on file    Relationship status: Not on file  . Intimate partner violence:    Fear of current or ex partner: Not on file    Emotionally abused: Not on file    Physically abused: Not on file    Forced sexual activity: Not on file  Other Topics Concern  . Not on file  Social History Narrative  . Not on file    Mr. Mcgarvey family history is not on file.      Objective:    Vitals:   07/03/17 0939  BP: 130/72  Pulse: 63    Physical Exam; well-developed male in no acute distress, blood pressure 130/72, pulse 63, height 5 foot 7, weight 181, BMI 27.7.  HEENT; nontraumatic normocephalic EOMI PERRLA sclera anicteric, Cardiovascular; regular rate and rhythm with S1-S2 no murmur rub or gallop, Pulmonary ;clear bilaterally, Abdomen; soft, he has some mild tenderness in the epigastrium there is no guarding or rebound no palpable mass or hepatosplenomegaly bowel sounds are present, Rectal; exam not done, Extremities ;no clubbing cyanosis or edema skin warm and dry, Neuro psych ;mood and affect appropriate       Assessment & Plan:   #42 42 year old male with 3-year history of postprandial upper abdominal discomfort bloating tightness and very occasional solid food dysphasia. Etiology of symptoms is not entirely clear.  Recent ultrasound negative for cholelithiasis.  Rule out functional dyspepsia, rule out chronic gastritis/peptic ulcer disease/H. pylori.  Rule out manifestation of GERD #2 mild increase in hepatic echogenicity on ultrasound, normal LFTs  Plan; we will check H. pylori antibody Patient will be scheduled  for upper endoscopy with Dr. Henrene Pastor.  Procedure was discussed in detail with the patient including indications risks and benefits and he is agreeable to proceed. We will give him a trial of GI cocktail 30 cc after meals as needed If EGD is completely negative consider CCK HIDA scan Yaneisy Wenz S Abdirahman Chittum PA-C 07/03/2017   Cc: No ref. provider found

## 2017-07-05 LAB — H PYLORI, IGM, IGG, IGA AB
H PYLORI IGG: 5.09 {index_val} — AB (ref 0.00–0.79)
H pylori, IgM Abs: <9 units (ref 0.0–8.9)
H. pylori, IgA Abs: 12.7 units — ABNORMAL HIGH (ref 0.0–8.9)

## 2017-07-08 ENCOUNTER — Other Ambulatory Visit: Payer: Self-pay

## 2017-07-08 MED ORDER — OMEPRAZOLE 40 MG PO CPDR: DELAYED_RELEASE_CAPSULE | ORAL | 0 refills | Status: DC

## 2017-07-08 MED ORDER — BIS SUBCIT-METRONID-TETRACYC 140-125-125 MG PO CAPS: 3.0000 | ORAL_CAPSULE | Freq: Three times a day (TID) | ORAL | 0 refills | Status: DC

## 2017-07-09 ENCOUNTER — Encounter: Payer: Self-pay | Admitting: Internal Medicine

## 2017-07-22 ENCOUNTER — Encounter: Payer: Self-pay | Admitting: Internal Medicine

## 2017-07-22 ENCOUNTER — Ambulatory Visit (AMBULATORY_SURGERY_CENTER): Payer: BLUE CROSS/BLUE SHIELD | Admitting: Internal Medicine

## 2017-07-22 VITALS — BP 112/71 | HR 53 | Temp 97.8°F | Resp 16 | Ht 67.0 in | Wt 181.0 lb

## 2017-07-22 DIAGNOSIS — R1013 Epigastric pain: Secondary | ICD-10-CM

## 2017-07-22 DIAGNOSIS — K219 Gastro-esophageal reflux disease without esophagitis: Secondary | ICD-10-CM

## 2017-07-22 MED ORDER — SODIUM CHLORIDE 0.9 % IV SOLN: 500.0000 mL | Freq: Once | INTRAVENOUS | Status: DC

## 2017-07-22 MED ORDER — OMEPRAZOLE 40 MG PO CPDR: 40.0000 mg | DELAYED_RELEASE_CAPSULE | Freq: Every day | ORAL | 11 refills | Status: AC

## 2017-07-22 NOTE — Progress Notes (Signed)
Pt's states no medical or surgical changes since previsit or office visit.Pt's states no medical or surgical changes since previsit or office visit. 

## 2017-07-22 NOTE — Patient Instructions (Signed)
YOU HAD AN ENDOSCOPIC PROCEDURE TODAY AT THE [facility]:   Refer to the procedure report that was given to you for any specific questions about what was found during the examination.  If the procedure report does not answer your questions, please call your gastroenterologist to clarify.  If you requested that your care partner not be given the details of your procedure findings, then the procedure report has been included in a sealed envelope for you to review at your convenience later.  YOU SHOULD EXPECT: Some feelings of bloating in the abdomen. Passage of more gas than usual.  Walking can help get rid of the air that was put into your GI tract during the procedure and reduce the bloating. If you had a lower endoscopy (such as a colonoscopy or flexible sigmoidoscopy) you may notice spotting of blood in your stool or on the toilet paper. If you underwent a bowel prep for your procedure, you may not have a normal bowel movement for a few days.  Please Note:  You might notice some irritation and congestion in your nose or some drainage.  This is from the oxygen used during your procedure.  There is no need for concern and it should clear up in a day or so.  SYMPTOMS TO REPORT IMMEDIATELY:   Following upper endoscopy (EGD)  Vomiting of blood or coffee ground material  New chest pain or pain under the shoulder blades  Painful or persistently difficult swallowing  New shortness of breath  Fever of 100F or higher  Black, tarry-looking stools  For urgent or emergent issues, a gastroenterologist can be reached at any hour by calling [phone].  *Handout given on GERD protocol.  Care partner discussed foods to avoid.  DIET:  We do recommend a small meal at first, but then you may proceed to your regular diet.  Drink plenty of fluids but you should avoid alcoholic beverages for 24 hours.  ACTIVITY:  You should plan to take it easy for the rest of today and you should NOT DRIVE or  use heavy machinery until tomorrow (because of the sedation medicines used during the test).    FOLLOW UP: Our staff will call the number listed on your records the next business day following your procedure to check on you and address any questions or concerns that you may have regarding the information given to you following your procedure. If we do not reach you, we will leave a message.  However, if you are feeling well and you are not experiencing any problems, there is no need to return our call.  We will assume that you have returned to your regular daily activities without incident.  If any biopsies were taken you will be contacted by phone or by letter within the next 1-3 weeks.  Please call us at [phone] if you have not heard about the biopsies in 3 weeks.    SIGNATURES/CONFIDENTIALITY: You and/or your care partner have signed paperwork which will be entered into your electronic medical record.  These signatures attest to the fact that that the information above on your After Visit Summary has been reviewed and is understood.  Full responsibility of the confidentiality of this discharge information lies with you and/or your care-partner.

## 2017-07-22 NOTE — Op Note (Signed)
[facility] Patient Name: Brandon Ward Procedure Date: 07/22/2017 10:42 AM MRN: [id number] Endoscopist: Wilhemina BonitoJohn N. Marina GoodellPerry , MD Age: [age] Referring MD:  Date of Birth: Aug 27, 1975 Gender: Male Account #: [id number] Procedure:                Upper GI endoscopy Indications:              Epigastric abdominal pain, Dysphagia, Heartburn.                            Seen in the emergency room. Seen in this office a                            few weeks ago. No response to GI cocktail. Being                            treated for Helicobacter pylori and symptoms have                            resolved Medicines:                Monitored Anesthesia Care Procedure:                Pre-Anesthesia Assessment:                           - Prior to the procedure, a History and Physical                            was performed, and patient medications and                            allergies were reviewed. The patient's tolerance of                            previous anesthesia was also reviewed. The risks                            and benefits of the procedure and the sedation                            options and risks were discussed with the patient.                            All questions were answered, and informed consent                            was obtained. Prior Anticoagulants: The patient has                            taken no previous anticoagulant or antiplatelet                            agents. ASA Grade Assessment: II - A patient with  mild systemic disease. After reviewing the risks                            and benefits, the patient was deemed in                            satisfactory condition to undergo the procedure.                           After obtaining informed consent, the endoscope was                            passed under direct vision. Throughout the                            procedure, the patient's blood pressure, pulse, and                           oxygen saturations were monitored continuously. The                            Endoscope was introduced through the mouth, and                            advanced to the second part of duodenum. The upper                            GI endoscopy was accomplished without difficulty.                            The patient tolerated the procedure well. Scope In: Scope Out: Findings:                 The esophagus was normal.                           The stomach was normal.                           The examined duodenum was normal.                           The cardia and gastric fundus were normal on                            retroflexion. Complications:            No immediate complications. Estimated Blood Loss:     Estimated blood loss: none. Impression:               1. Normal EGD                           2. GERD                           3. Positive Helicobacter pylori antibody. Being  treated. Recommendation:           1. Complete medications as prescribed                           2. After completing medications continue on                            omeprazole 40 mg daily until further notice                           3. Please make office follow-up appointment with                            Dr. Marina Goodell in about 8-10 weeks. Wilhemina Bonito. Marina Goodell, MD 07/22/2017 11:12:38 AM This report has been signed electronically.

## 2017-07-22 NOTE — Progress Notes (Signed)
Spontaneous respirations throughout. VSS. Resting comfortably. To PACU on room air. Report to  RN. 

## 2017-07-23 ENCOUNTER — Telehealth: Payer: Self-pay

## 2017-07-23 NOTE — Telephone Encounter (Signed)
  Follow up Call-  Call back number 07/22/2017  Post procedure Call Back phone  # [phone]  Permission to leave phone message Yes  Some recent data might be hidden    No answer.  Will try again this afternoon. Angela/Call-back

## 2017-07-23 NOTE — Telephone Encounter (Signed)
  Follow up Call-  Call back number 07/22/2017  Post procedure Call Back phone  # [phone]  Permission to leave phone message Yes  Some recent data might be hidden     Patient questions:  Do you have a fever, pain , or abdominal swelling? No. Pain Score  0 *  Have you tolerated food without any problems? Yes.    Have you been able to return to your normal activities? Yes.    Do you have any questions about your discharge instructions: Diet   No. Medications  No. Follow up visit  No.  Do you have questions or concerns about your Care? No.  Actions: * If pain score is 4 or above: No action needed, pain <4.

## 2017-09-22 ENCOUNTER — Ambulatory Visit: Payer: BLUE CROSS/BLUE SHIELD | Admitting: Internal Medicine

## 2017-10-23 ENCOUNTER — Ambulatory Visit (INDEPENDENT_AMBULATORY_CARE_PROVIDER_SITE_OTHER): Payer: BLUE CROSS/BLUE SHIELD | Admitting: Urgent Care

## 2017-10-23 ENCOUNTER — Other Ambulatory Visit: Payer: Self-pay

## 2017-10-23 ENCOUNTER — Ambulatory Visit (INDEPENDENT_AMBULATORY_CARE_PROVIDER_SITE_OTHER): Payer: BLUE CROSS/BLUE SHIELD

## 2017-10-23 ENCOUNTER — Encounter: Payer: Self-pay | Admitting: Urgent Care

## 2017-10-23 VITALS — BP 120/75 | HR 65 | Temp 97.5°F | Resp 16 | Ht 67.0 in | Wt 169.4 lb

## 2017-10-23 DIAGNOSIS — R0602 Shortness of breath: Secondary | ICD-10-CM

## 2017-10-23 DIAGNOSIS — Z566 Other physical and mental strain related to work: Secondary | ICD-10-CM

## 2017-10-23 DIAGNOSIS — Z8619 Personal history of other infectious and parasitic diseases: Secondary | ICD-10-CM | POA: Diagnosis not present

## 2017-10-23 DIAGNOSIS — R079 Chest pain, unspecified: Secondary | ICD-10-CM

## 2017-10-23 DIAGNOSIS — F17200 Nicotine dependence, unspecified, uncomplicated: Secondary | ICD-10-CM | POA: Diagnosis not present

## 2017-10-23 NOTE — Progress Notes (Signed)
    MRN: [id number] DOB: 1975-12-10  Subjective:   Brandon Ward is a 42 y.o. male presenting for 3-year history of persistent epigastric to midsternal chest pain.  Pain does not occur daily but is worse at times with food, has also had shortness of breath intermittently.  Denies fever, nausea, vomiting, diarrhea, constipation, sore throat, dizziness.  Patient has a GI doctor, had an endoscopy that was normal this past April 2019.  He tested positive for H. pylori antibodies and has taken Pylera to treat this.  Currently, he takes omeprazole daily.  Has been smoking 1 pack/day for the past 3 months.  He had quit smoking for about 4 years prior to that.  Start smoking again due to work stress.  He was advised to follow-up with his GI doctor this month but patient has not set up an appointment.   York SpanielSaid has a current medication list which includes the following prescription(s): omeprazole. Also is allergic to pain & fever [acetaminophen].  York SpanielSaid  has a past medical history of GERD (gastroesophageal reflux disease). Denies past surgical history. Denies family history of heart disease, arrhythmia.   Objective:   Vitals: BP 120/75   Pulse 65   Temp (!) 97.5 F (36.4 C)   Resp 16   Ht 5\' 7"  (1.702 m)   Wt 169 lb 6.4 oz (76.8 kg)   SpO2 100%   BMI 26.53 kg/m   Physical Exam  Constitutional: He is oriented to person, place, and time. He appears well-developed and well-nourished.  HENT:  Mouth/Throat: Oropharynx is clear and moist.  Eyes: No scleral icterus.  Cardiovascular: Normal rate, regular rhythm, normal heart sounds and intact distal pulses. Exam reveals no gallop and no friction rub.  No murmur heard. Pulmonary/Chest: Effort normal and breath sounds normal. No stridor. No respiratory distress. He has no wheezes. He has no rales. He exhibits no tenderness.  Abdominal: Soft. Bowel sounds are normal. He exhibits no distension and no mass. There is no tenderness. There is no rebound and no  guarding.  Neurological: He is alert and oriented to person, place, and time.  Skin: Skin is warm and dry.  Psychiatric: He has a normal mood and affect.   ECG interpretation - sinus rhythm at 65 bpm.  Dg Chest 2 View  Result Date: 10/23/2017 CLINICAL DATA:  Atypical chest pain.  Shortness of breath. EXAM: CHEST - 2 VIEW COMPARISON:  05/25/2016. FINDINGS: Mediastinum and hilar structures normal. Low lung volumes with mild bibasilar atelectasis. No pleural effusion or pneumothorax. No acute bony abnormality. Degenerative change thoracic spine. IMPRESSION: No acute cardiopulmonary disease. Electronically Signed   By: Maisie Fushomas  Register   On: 10/23/2017 09:13   Assessment and Plan :   Chest pain, unspecified type - Plan: EKG 12-Lead, DG Chest 2 View  Shortness of breath - Plan: DG Chest 2 View  History of Helicobacter pylori infection  Tobacco use disorder  Work-related stress  Physical exam findings, chest x-ray and EKG are reassuring.  Patient was advised to follow-up with his GI doctor.  He declined referral to cardiology and blood test today.  Counseled patient on quitting smoking but he is not ready to try this today.  Return to clinic precautions reviewed.  Wallis BambergMario Briahna Pescador, PA-C [facility] [phone] 10/23/2017  8:59 AM

## 2017-10-23 NOTE — Patient Instructions (Addendum)
Nonspecific Chest Pain Chest pain can be caused by many different conditions. There is always a chance that your pain could be related to something serious, such as a heart attack or a blood clot in your lungs. Chest pain can also be caused by conditions that are not life-threatening. If you have chest pain, it is very important to follow up with your health care provider. What are the causes? Causes of this condition include:  Heartburn.  Pneumonia or bronchitis.  Anxiety or stress.  Inflammation around your heart (pericarditis) or lung (pleuritis or pleurisy).  A blood clot in your lung.  A collapsed lung (pneumothorax). This can develop suddenly on its own (spontaneous pneumothorax) or from trauma to the chest.  Shingles infection (varicella-zoster virus).  Heart attack.  Damage to the bones, muscles, and cartilage that make up your chest wall. This can include: ? Bruised bones due to injury. ? Strained muscles or cartilage due to frequent or repeated coughing or overwork. ? Fracture to one or more ribs. ? Sore cartilage due to inflammation (costochondritis).  What increases the risk? Risk factors for this condition may include:  Activities that increase your risk for trauma or injury to your chest.  Respiratory infections or conditions that cause frequent coughing.  Medical conditions or overeating that can cause heartburn.  Heart disease or family history of heart disease.  Conditions or health behaviors that increase your risk of developing a blood clot.  Having had chicken pox (varicella zoster).  What are the signs or symptoms? Chest pain can feel like:  Burning or tingling on the surface of your chest or deep in your chest.  Crushing, pressure, aching, or squeezing pain.  Dull or sharp pain that is worse when you move, cough, or take a deep breath.  Pain that is also felt in your back, neck, shoulder, or arm, or pain that spreads to any of these  areas.  Your chest pain may come and go, or it may stay constant. How is this diagnosed? Lab tests or other studies may be needed to find the cause of your pain. Your health care provider may have you take a test called an ECG (electrocardiogram). An ECG records your heartbeat patterns at the time the test is performed. You may also have other tests, such as:  Transthoracic echocardiogram (TTE). In this test, sound waves are used to create a picture of the heart structures and to look at how blood flows through your heart.  Transesophageal echocardiogram (TEE).This is a more advanced imaging test that takes images from inside your body. It allows your health care provider to see your heart in finer detail.  Cardiac monitoring. This allows your health care provider to monitor your heart rate and rhythm in real time.  Holter monitor. This is a portable device that records your heartbeat and can help to diagnose abnormal heartbeats. It allows your health care provider to track your heart activity for several days, if needed.  Stress tests. These can be done through exercise or by taking medicine that makes your heart beat more quickly.  Blood tests.  Other imaging tests.  How is this treated? Treatment depends on what is causing your chest pain. Treatment may include:  Medicines. These may include: ? Acid blockers for heartburn. ? Anti-inflammatory medicine. ? Pain medicine for inflammatory conditions. ? Antibiotic medicine, if an infection is present. ? Medicines to dissolve blood clots. ? Medicines to treat coronary artery disease (CAD).  Supportive care for conditions that   do not require medicines. This may include: ? Resting. ? Applying heat or cold packs to injured areas. ? Limiting activities until pain decreases.  Follow these instructions at home: Medicines  If you were prescribed an antibiotic, take it as told by your health care provider. Do not stop taking the  antibiotic even if you start to feel better.  Take over-the-counter and prescription medicines only as told by your health care provider. Lifestyle  Do not use any products that contain nicotine or tobacco, such as cigarettes and e-cigarettes. If you need help quitting, ask your health care provider.  Do not drink alcohol.  Make lifestyle changes as directed by your health care provider. These may include: ? Getting regular exercise. Ask your health care provider to suggest some activities that are safe for you. ? Eating a heart-healthy diet. A registered dietitian can help you to learn healthy eating options. ? Maintaining a healthy weight. ? Managing diabetes, if necessary. ? Reducing stress, such as with yoga or relaxation techniques. General instructions  Avoid any activities that bring on chest pain.  If heartburn is the cause for your chest pain, raise (elevate) the head of your bed about 6 inches (15 cm) by putting blocks under the legs. Sleeping with more pillows does not effectively relieve heartburn because it only changes the position of your head.  Keep all follow-up visits as told by your health care provider. This is important. This includes any further testing if your chest pain does not go away. Contact a health care provider if:  Your chest pain does not go away.  You have a rash with blisters on your chest.  You have a fever.  You have chills. Get help right away if:  Your chest pain is worse.  You have a cough that gets worse, or you cough up blood.  You have severe pain in your abdomen.  You have severe weakness.  You faint.  You have sudden, unexplained chest discomfort.  You have sudden, unexplained discomfort in your arms, back, neck, or jaw.  You have shortness of breath at any time.  You suddenly start to sweat, or your skin gets clammy.  You feel nauseous or you vomit.  You suddenly feel light-headed or dizzy.  Your heart begins to beat  quickly, or it feels like it is skipping beats. These symptoms may represent a serious problem that is an emergency. Do not wait to see if the symptoms will go away. Get medical help right away. Call your local emergency services (911 in the [place] Do not drive yourself to the hospital. This information is not intended to replace advice given to you by your health care provider. Make sure you discuss any questions you have with your health care provider. Document Released: 12/26/2004 Document Revised: 12/11/2015 Document Reviewed: 12/11/2015 Elsevier Interactive Patient Education  2017 Elsevier [employer]     IF you received an x-ray today, you will receive an invoice from [facility]. Please contact [facility] at [phone] with questions or concerns regarding your invoice.   IF you received labwork today, you will receive an invoice from [facility]. Please contact [facility] at [phone] with questions or concerns regarding your invoice.   Our billing staff will not be able to assist you with questions regarding bills from these companies.  You will be contacted with the lab results as soon as they are available. The fastest way to get your results is to activate your My Chart account. Instructions are located   on the last page of this paperwork. If you have not heard from us regarding the results in 2 weeks, please contact this office.      

## 2018-05-20 ENCOUNTER — Encounter (HOSPITAL_COMMUNITY): Payer: Self-pay | Admitting: *Deleted

## 2018-05-20 ENCOUNTER — Emergency Department (HOSPITAL_COMMUNITY)
Admission: EM | Admit: 2018-05-20 | Discharge: 2018-05-20 | Disposition: A | Discharge status: Home / Self Care | Payer: BLUE CROSS/BLUE SHIELD | Attending: Emergency Medicine | Admitting: Emergency Medicine

## 2018-05-20 ENCOUNTER — Other Ambulatory Visit: Payer: Self-pay

## 2018-05-20 ENCOUNTER — Emergency Department (HOSPITAL_COMMUNITY): Payer: BLUE CROSS/BLUE SHIELD

## 2018-05-20 DIAGNOSIS — R079 Chest pain, unspecified: Secondary | ICD-10-CM | POA: Insufficient documentation

## 2018-05-20 DIAGNOSIS — Z5321 Procedure and treatment not carried out due to patient leaving prior to being seen by health care provider: Secondary | ICD-10-CM | POA: Insufficient documentation

## 2018-05-20 LAB — CBC
HCT: 44.5 % (ref 39.0–52.0)
Hemoglobin: 14.7 g/dL (ref 13.0–17.0)
MCH: 30.4 pg (ref 26.0–34.0)
MCHC: 33 g/dL (ref 30.0–36.0)
MCV: 91.9 fL (ref 80.0–100.0)
NRBC: 0 % (ref 0.0–0.2)
Platelets: 255 10*3/uL (ref 150–400)
RBC: 4.84 MIL/uL (ref 4.22–5.81)
RDW: 12.7 % (ref 11.5–15.5)
WBC: 7.2 10*3/uL (ref 4.0–10.5)

## 2018-05-20 LAB — BASIC METABOLIC PANEL
Anion gap: 9 (ref 5–15)
BUN: 11 mg/dL (ref 6–20)
CHLORIDE: 101 mmol/L (ref 98–111)
CO2: 25 mmol/L (ref 22–32)
CREATININE: 0.91 mg/dL (ref 0.61–1.24)
Calcium: 9.2 mg/dL (ref 8.9–10.3)
GFR calc Af Amer: >60 mL/min (ref >60)
GFR calc non Af Amer: >60 mL/min (ref >60)
Glucose, Bld: 101 mg/dL — ABNORMAL HIGH (ref 70–99)
Potassium: 3.3 mmol/L — ABNORMAL LOW (ref 3.5–5.1)
Sodium: 135 mmol/L (ref 135–145)

## 2018-05-20 LAB — I-STAT TROPONIN, ED: Troponin i, poc: 0 ng/mL (ref 0.00–0.08)

## 2018-05-20 MED ORDER — SODIUM CHLORIDE 0.9% FLUSH: 3.0000 mL | Freq: Once | INTRAVENOUS | Status: DC

## 2018-05-20 NOTE — ED Triage Notes (Addendum)
Onset of left chest pain involving left arm, shortness of breath. Pt is poor historian. After long detailed story, pt has history of GERD and feels it may be related.

## 2018-10-14 ENCOUNTER — Encounter (HOSPITAL_COMMUNITY): Payer: Self-pay | Admitting: Emergency Medicine

## 2018-10-14 ENCOUNTER — Ambulatory Visit (HOSPITAL_COMMUNITY)
Admission: EM | Admit: 2018-10-14 | Discharge: 2018-10-14 | Disposition: A | Discharge status: Home / Self Care | Payer: BLUE CROSS/BLUE SHIELD | Attending: Family Medicine | Admitting: Family Medicine

## 2018-10-14 ENCOUNTER — Other Ambulatory Visit: Payer: Self-pay

## 2018-10-14 DIAGNOSIS — Z23 Encounter for immunization: Secondary | ICD-10-CM | POA: Diagnosis not present

## 2018-10-14 DIAGNOSIS — W01118A Fall on same level from slipping, tripping and stumbling with subsequent striking against other sharp object, initial encounter: Secondary | ICD-10-CM

## 2018-10-14 DIAGNOSIS — S0990XA Unspecified injury of head, initial encounter: Secondary | ICD-10-CM | POA: Diagnosis not present

## 2018-10-14 DIAGNOSIS — S01112A Laceration without foreign body of left eyelid and periocular area, initial encounter: Secondary | ICD-10-CM

## 2018-10-14 MED ORDER — IBUPROFEN 600 MG PO TABS: 600.0000 mg | ORAL_TABLET | Freq: Four times a day (QID) | ORAL | 0 refills | Status: DC | PRN

## 2018-10-14 MED ORDER — LIDOCAINE HCL 2 % IJ SOLN
INTRAMUSCULAR | Status: AC
  Filled 2018-10-14: qty 20

## 2018-10-14 MED ORDER — TETANUS-DIPHTH-ACELL PERTUSSIS 5-2.5-18.5 LF-MCG/0.5 IM SUSP
0.5000 mL | Freq: Once | INTRAMUSCULAR | Status: AC
  Administered 2018-10-14: 0.5 mL via INTRAMUSCULAR

## 2018-10-14 MED ORDER — TETANUS-DIPHTH-ACELL PERTUSSIS 5-2.5-18.5 LF-MCG/0.5 IM SUSP
INTRAMUSCULAR | Status: AC
  Filled 2018-10-14: qty 0.5

## 2018-10-14 NOTE — ED Provider Notes (Signed)
[facility]    CSN: [id number] Arrival date & time: 10/14/18  1157      History   Chief Complaint Chief Complaint  Patient presents with  . Fall  . Head Laceration    HPI Brandon Ward is a 43 y.o. male history of GERD presenting today for evaluation of head injury.  Patient was washing his shower and slipped and hit his head on the faucet.  He denies loss of consciousness.  He has had headache and pain around the laceration he sustained to his left eyebrow, but denies pain with moving eye or changes in vision.  Denies dizziness or lightheadedness.  Denies nausea or vomiting.  Denies weakness.  Unsure when his last tetanus was.  HPI  Past Medical History:  Diagnosis Date  . GERD (gastroesophageal reflux disease)     Patient Active Problem List   Diagnosis Date Noted  . Chest pain 10/23/2017  . Dyspnea 11/22/2014    History reviewed. No pertinent surgical history.     Home Medications    Prior to Admission medications   Medication Sig Start Date End Date Taking? Authorizing Provider  ibuprofen (ADVIL) 600 MG tablet Take 1 tablet (600 mg total) by mouth every 6 (six) hours as needed. 10/14/18   Wieters, Hallie C, PA-C  omeprazole (PRILOSEC) 40 MG capsule Take 1 capsule (40 mg total) by mouth daily. 07/22/17   Hilarie FredricksonPerry, John N, MD    Family History Family History  Problem Relation Age of Onset  . Stomach cancer Neg Hx   . Colon cancer Neg Hx     Social History Social History   Tobacco Use  . Smoking status: Former Smoker    Packs/day: 1.50    Years: 18.00    Pack years: 27.00    Types: Cigarettes    Quit date: 07/31/2014    Years since quitting: 4.2  . Smokeless tobacco: Never Used  Substance Use Topics  . Alcohol use: Yes    Alcohol/week: 0.0 standard drinks    Comment: sometimes  . Drug use: No     Allergies   Pain & fever [acetaminophen]   Review of Systems Review of Systems  Constitutional: Negative for fatigue and fever.  HENT:  Negative for congestion, sinus pressure and sore throat.   Eyes: Negative for photophobia, pain and visual disturbance.  Respiratory: Negative for cough and shortness of breath.   Cardiovascular: Negative for chest pain.  Gastrointestinal: Negative for abdominal pain, nausea and vomiting.  Genitourinary: Negative for decreased urine volume and hematuria.  Musculoskeletal: Negative for myalgias, neck pain and neck stiffness.  Skin: Positive for wound. Negative for color change.  Neurological: Positive for headaches. Negative for dizziness, syncope, facial asymmetry, speech difficulty, weakness, light-headedness and numbness.     Physical Exam Triage Vital Signs ED Triage Vitals [10/14/18 1322]  Enc Vitals Group     BP 128/83     Pulse Rate 60     Resp 16     Temp 97.8 F (36.6 C)     Temp src      SpO2 98 %     Weight      Height      Head Circumference      Peak Flow      Pain Score 4     Pain Loc      Pain Edu?      Excl. in [place]?    No data found.  Updated Vital Signs BP 128/83  Pulse 60   Temp 97.8 F (36.6 C)   Resp 16   SpO2 98%   Visual Acuity Right Eye Distance:   Left Eye Distance:   Bilateral Distance:    Right Eye Near:   Left Eye Near:    Bilateral Near:     Physical Exam Vitals signs and nursing note reviewed.  Constitutional:      Appearance: He is well-developed.  HENT:     Head: Normocephalic and atraumatic.     Ears:     Comments: No hemotympanum    Mouth/Throat:     Comments: Oral mucosa pink and moist, no tonsillar enlargement or exudate. Posterior pharynx patent and nonerythematous, no uvula deviation or swelling. Normal phonation. Palate elevates symmetrically Eyes:     Extraocular Movements: Extraocular movements intact.     Conjunctiva/sclera: Conjunctivae normal.     Pupils: Pupils are equal, round, and reactive to light.     Comments: No pain with extraocular motions  Neck:     Musculoskeletal: Neck supple.  Cardiovascular:      Rate and Rhythm: Normal rate and regular rhythm.     Heart sounds: No murmur.  Pulmonary:     Effort: Pulmonary effort is normal. No respiratory distress.     Breath sounds: Normal breath sounds.  Abdominal:     Palpations: Abdomen is soft.     Tenderness: There is no abdominal tenderness.  Skin:    General: Skin is warm and dry.     Comments: 3 cm circular laceration to left frontal/left eyebrow, gaping and approximately 4 mm deep, no palpable crepitus or deformity below wound or periorbital area  Neurological:     General: No focal deficit present.     Mental Status: He is alert and oriented to person, place, and time. Mental status is at baseline.     Cranial Nerves: No cranial nerve deficit.     Motor: No weakness.     Gait: Gait normal.     Comments: Speech clear, face symmetric, moving all extremities appropriately      UC Treatments / Results  Labs (all labs ordered are listed, but only abnormal results are displayed) Labs Reviewed - No data to display  EKG   Radiology No results found.  Procedures Laceration Repair  Date/Time: 10/14/2018 2:46 PM Performed by: Wieters, Junius CreamerHallie C, PA-C Authorized by: Mardella LaymanHagler, Brian, MD   Consent:    Consent obtained:  Verbal   Consent given by:  Patient   Risks discussed:  Pain, poor cosmetic result and infection   Alternatives discussed:  No treatment Anesthesia (see MAR for exact dosages):    Anesthesia method:  Local infiltration   Local anesthetic:  Lidocaine 2% w/o epi Laceration details:    Location:  Face   Face location:  L eyebrow   Length (cm):  3   Depth (mm):  4 Repair type:    Repair type:  Simple Pre-procedure details:    Preparation:  Patient was prepped and draped in usual sterile fashion Exploration:    Hemostasis achieved with:  Direct pressure   Wound exploration: wound explored through full range of motion     Wound extent: no foreign bodies/material noted, no underlying fracture noted and no  vascular damage noted     Contaminated: no   Treatment:    Area cleansed with:  Shur-Clens and soap and water   Amount of cleaning:  Standard   Irrigation solution:  Sterile saline   Irrigation volume:  500 cc   Irrigation method:  Syringe   Visualized foreign bodies/material removed: no   Skin repair:    Repair method:  Sutures   Suture size:  6-0   Suture material:  Prolene   Suture technique:  Simple interrupted   Number of sutures:  6 Approximation:    Approximation:  Loose Post-procedure details:    Dressing:  Non-adherent dressing   Patient tolerance of procedure:  Tolerated well, no immediate complications   (including critical care time)  Medications Ordered in UC Medications  Tdap (BOOSTRIX) injection 0.5 mL (0.5 mLs Intramuscular Given 10/14/18 1433)  lidocaine (XYLOCAINE) 2 % (with pres) injection (has no administration in time range)  Tdap (BOOSTRIX) 5-2.5-18.5 LF-MCG/0.5 injection (has no administration in time range)    Initial Impression / Assessment and Plan / UC Course  I have reviewed the triage vital signs and the nursing notes.  Pertinent labs & imaging results that were available during my care of the patient were reviewed by me and considered in my medical decision making (see chart for details).     Laceration repaired, greater than 12 hours since incident, but less than 24.  Given appearance of wound and on face, opted for wound repair.  Sutures placed, will return in 5 to 7 days for removal.  Discussed wound care.  Keep clean and dry.  Tylenol and ibuprofen for pain and headache.  No neuro deficits on exam, do not suspect underlying fracture or intracranial abnormality.Discussed strict return precautions. Patient verbalized understanding and is agreeable with plan.  Final Clinical Impressions(s) / UC Diagnoses   Final diagnoses:  Injury of head, initial encounter  Laceration of left periocular area without foreign body, initial encounter      Discharge Instructions     WOUND CARE Please return in 5-7 days to have your stitches/staples removed or sooner if you have concerns. Marland Kitchen Keep area clean and dry for 24 hours. Do not remove bandage, if applied. . After 24 hours, remove bandage and wash wound gently with mild soap and warm water. Reapply a new bandage after cleaning wound, if directed. . Continue daily cleansing with soap and water until stitches/staples are removed. . Do not apply any ointments or creams to the wound while stitches/staples are in place, as this may cause delayed healing. . Notify the office if you experience any of the following signs of infection: Swelling, redness, pus drainage, streaking, fever >101.0 F . Notify the office if you experience excessive bleeding that does not stop after 15-20 minutes of constant, firm pressure.   ED Prescriptions    Medication Sig Dispense Auth. Provider   ibuprofen (ADVIL) 600 MG tablet Take 1 tablet (600 mg total) by mouth every 6 (six) hours as needed. 30 tablet Wieters, [place] C, PA-C     Controlled Substance Prescriptions [place] Controlled Substance Registry consulted? Not Applicable   Janith Lima, [place] 10/14/18 1451

## 2018-10-14 NOTE — ED Triage Notes (Signed)
Pt states last night he slipped in the shower and hit his head on the facet. denies LOC. circular laceration to L upper eyebrow.

## 2018-10-14 NOTE — Discharge Instructions (Signed)

## 2018-10-20 ENCOUNTER — Other Ambulatory Visit: Payer: Self-pay

## 2018-10-20 ENCOUNTER — Ambulatory Visit (HOSPITAL_COMMUNITY)
Admission: EM | Admit: 2018-10-20 | Discharge: 2018-10-20 | Disposition: A | Discharge status: Home / Self Care | Payer: BLUE CROSS/BLUE SHIELD | Attending: Family Medicine | Admitting: Family Medicine

## 2018-10-20 DIAGNOSIS — Z4802 Encounter for removal of sutures: Secondary | ICD-10-CM

## 2018-10-20 NOTE — Discharge Instructions (Addendum)
Sutures removed, continue to keep clean and dry May try mederma in 1 week to help with scar

## 2018-10-20 NOTE — ED Notes (Signed)
Six stitches removed by [facility]

## 2018-10-20 NOTE — ED Provider Notes (Signed)
[facility]    CSN: [id number] Arrival date & time: 10/20/18  0957      History   Chief Complaint No chief complaint on file. Suture Removal  HPI Brandon Ward is a 43 y.o. male history of GERD presents today for suture removal.  Patient had head injury on 7/15 and had sutures placed.  Presenting today for suture removal.  He has had some occasional sensations of pain, but overall healing well and pain significantly improved from initial visit.  Denies any drainage or swelling.  Is concerned about scar.  HPI  Past Medical History:  Diagnosis Date  . GERD (gastroesophageal reflux disease)     Patient Active Problem List   Diagnosis Date Noted  . Chest pain 10/23/2017  . Dyspnea 11/22/2014    No past surgical history on file.     Home Medications    Prior to Admission medications   Medication Sig Start Date End Date Taking? Authorizing Provider  ibuprofen (ADVIL) 600 MG tablet Take 1 tablet (600 mg total) by mouth every 6 (six) hours as needed. 10/14/18   Tahliyah Anagnos C, PA-C  omeprazole (PRILOSEC) 40 MG capsule Take 1 capsule (40 mg total) by mouth daily. 07/22/17   Irene Shipper, MD    Family History Family History  Problem Relation Age of Onset  . Stomach cancer Neg Hx   . Colon cancer Neg Hx     Social History Social History   Tobacco Use  . Smoking status: Former Smoker    Packs/day: 1.50    Years: 18.00    Pack years: 27.00    Types: Cigarettes    Quit date: 07/31/2014    Years since quitting: 4.2  . Smokeless tobacco: Never Used  Substance Use Topics  . Alcohol use: Yes    Alcohol/week: 0.0 standard drinks    Comment: sometimes  . Drug use: No     Allergies   Pain & fever [acetaminophen]   Review of Systems Review of Systems  Constitutional: Negative for fatigue and fever.  Respiratory: Negative for shortness of breath.   Gastrointestinal: Negative for nausea and vomiting.  Skin: Positive for wound. Negative for color change.   Neurological: Negative for dizziness, light-headedness and headaches.     Physical Exam Triage Vital Signs ED Triage Vitals  Enc Vitals Group     BP      Pulse      Resp      Temp      Temp src      SpO2      Weight      Height      Head Circumference      Peak Flow      Pain Score      Pain Loc      Pain Edu?      Excl. in [place]?    No data found.  Updated Vital Signs There were no vitals taken for this visit.  Visual Acuity Right Eye Distance:   Left Eye Distance:   Bilateral Distance:    Right Eye Near:   Left Eye Near:    Bilateral Near:     Physical Exam Vitals signs and nursing note reviewed.  Constitutional:      Appearance: He is well-developed.     Comments: No acute distress  HENT:     Head: Normocephalic and atraumatic.     Nose: Nose normal.  Eyes:     Conjunctiva/sclera: Conjunctivae normal.  Neck:  Musculoskeletal: Neck supple.  Cardiovascular:     Rate and Rhythm: Normal rate.  Pulmonary:     Effort: Pulmonary effort is normal. No respiratory distress.  Abdominal:     General: There is no distension.  Musculoskeletal: Normal range of motion.  Skin:    General: Skin is warm and dry.     Comments: Well-healing scabbing laceration to left forehead/eyebrow; no surrounding erythema  Neurological:     Mental Status: He is alert and oriented to person, place, and time.      UC Treatments / Results  Labs (all labs ordered are listed, but only abnormal results are displayed) Labs Reviewed - No data to display  EKG   Radiology No results found.  Procedures Procedures (including critical care time)  Medications Ordered in UC Medications - No data to display  Initial Impression / Assessment and Plan / UC Course  I have reviewed the triage vital signs and the nursing notes.  Pertinent labs & imaging results that were available during my care of the patient were reviewed by me and considered in my medical decision making (see  chart for details).     6 sutures removed, laceration with wound edges intact, 1 suture was more difficult to remove causing minimal reopening of skin.  Applied bacitracin and Band-Aid.  Would expect continued healing.  Discussed may use Mederma for scars.Discussed strict return precautions. Patient verbalized understanding and is agreeable with plan.  Final Clinical Impressions(s) / UC Diagnoses   Final diagnoses:  Visit for suture removal     Discharge Instructions     Sutures removed, continue to keep clean and dry May try mederma in 1 week to help with scar   ED Prescriptions    None     Controlled Substance Prescriptions [place] Controlled Substance Registry consulted? Not Applicable   Lew DawesWieters, Penny Frisbie C, [place]PA-C 10/20/18 1034

## 2019-11-30 IMAGING — CR DG CHEST 2V
2 series · 2 of 2 positions shown · non-contrast
Comparison: Two-view chest x-ray 10/23/2017

CLINICAL DATA: New onset of left-sided chest pain extending into
the left arm. Shortness of breath. Gastroesophageal reflux disease.

EXAM:
CHEST - 2 VIEW

[w chest pa]
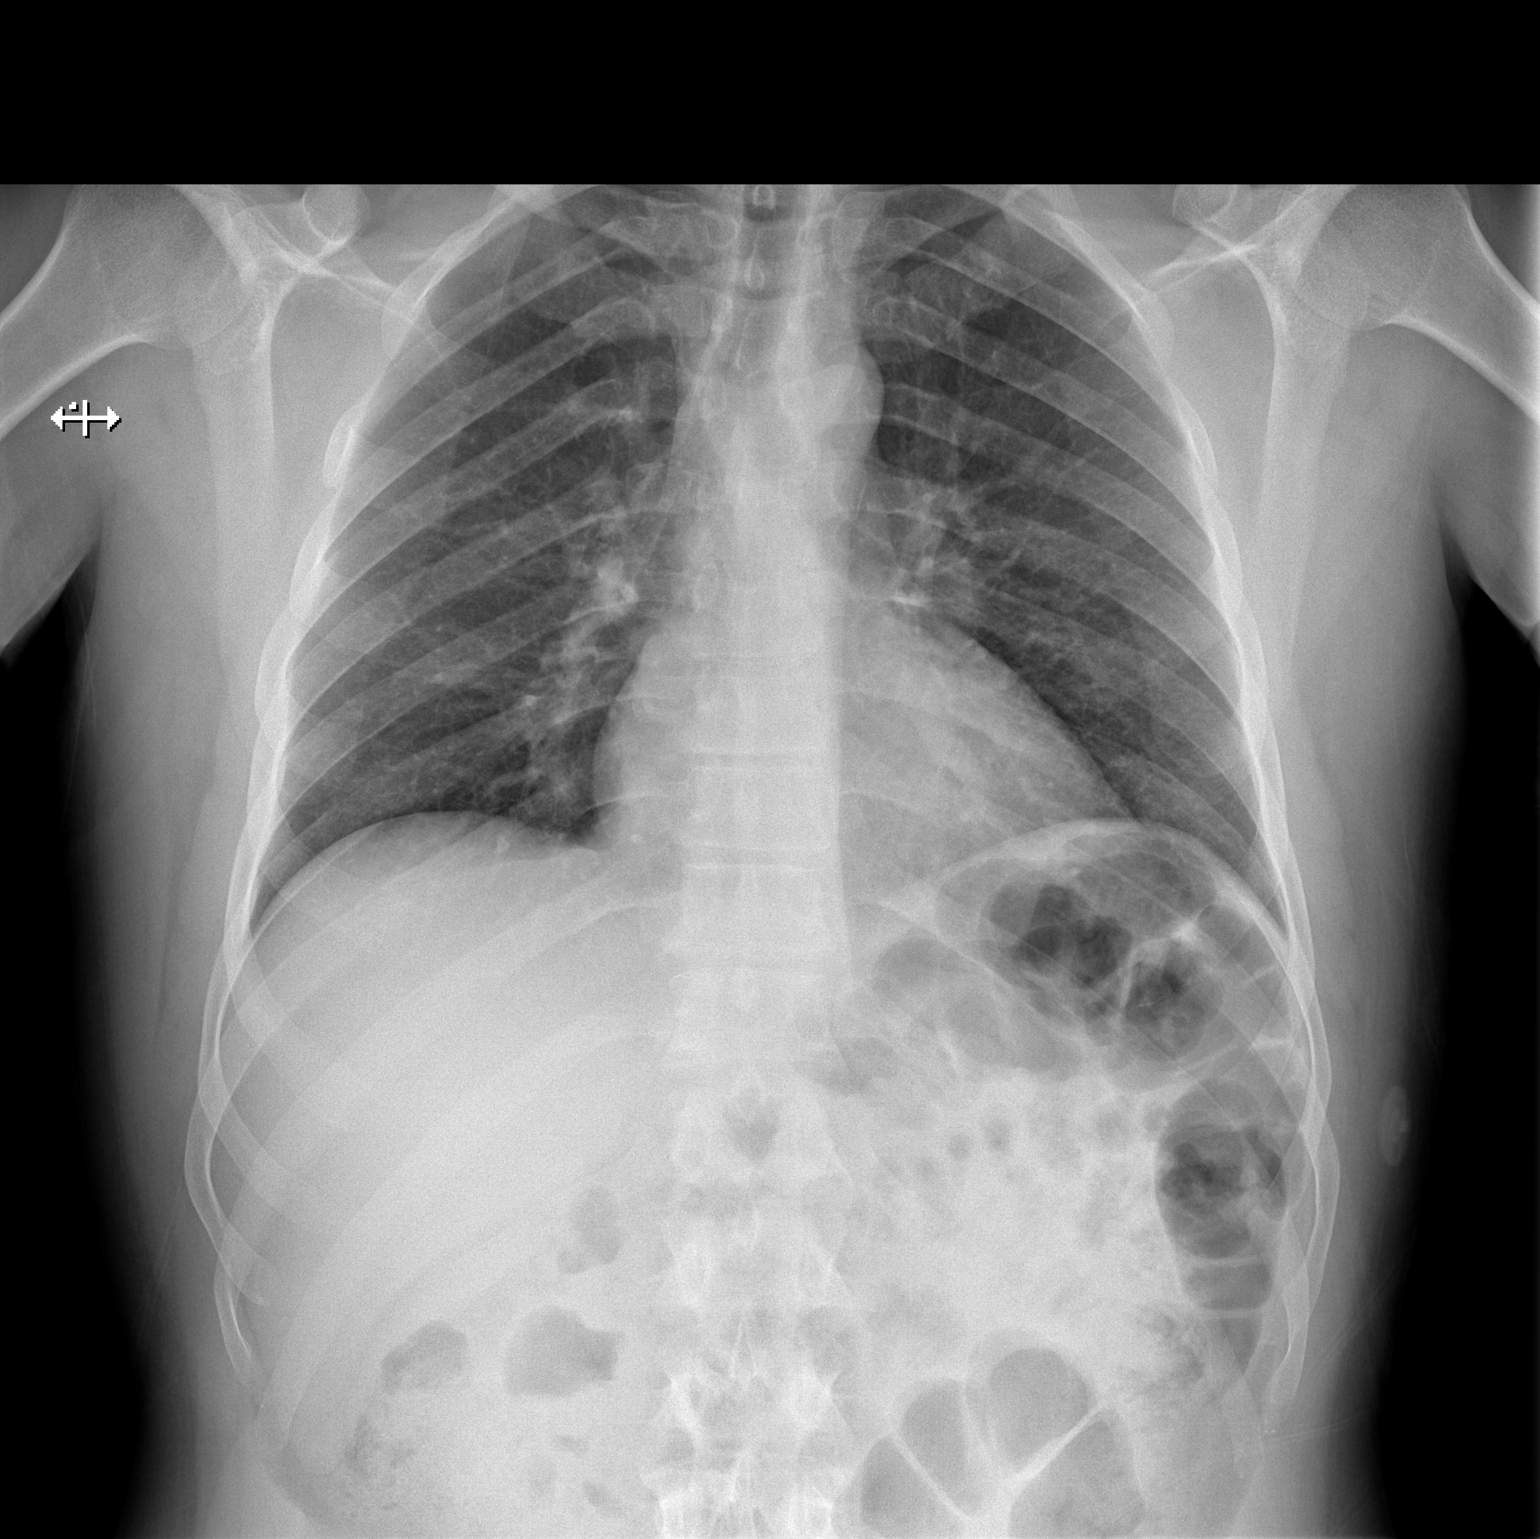

[w chest lat]
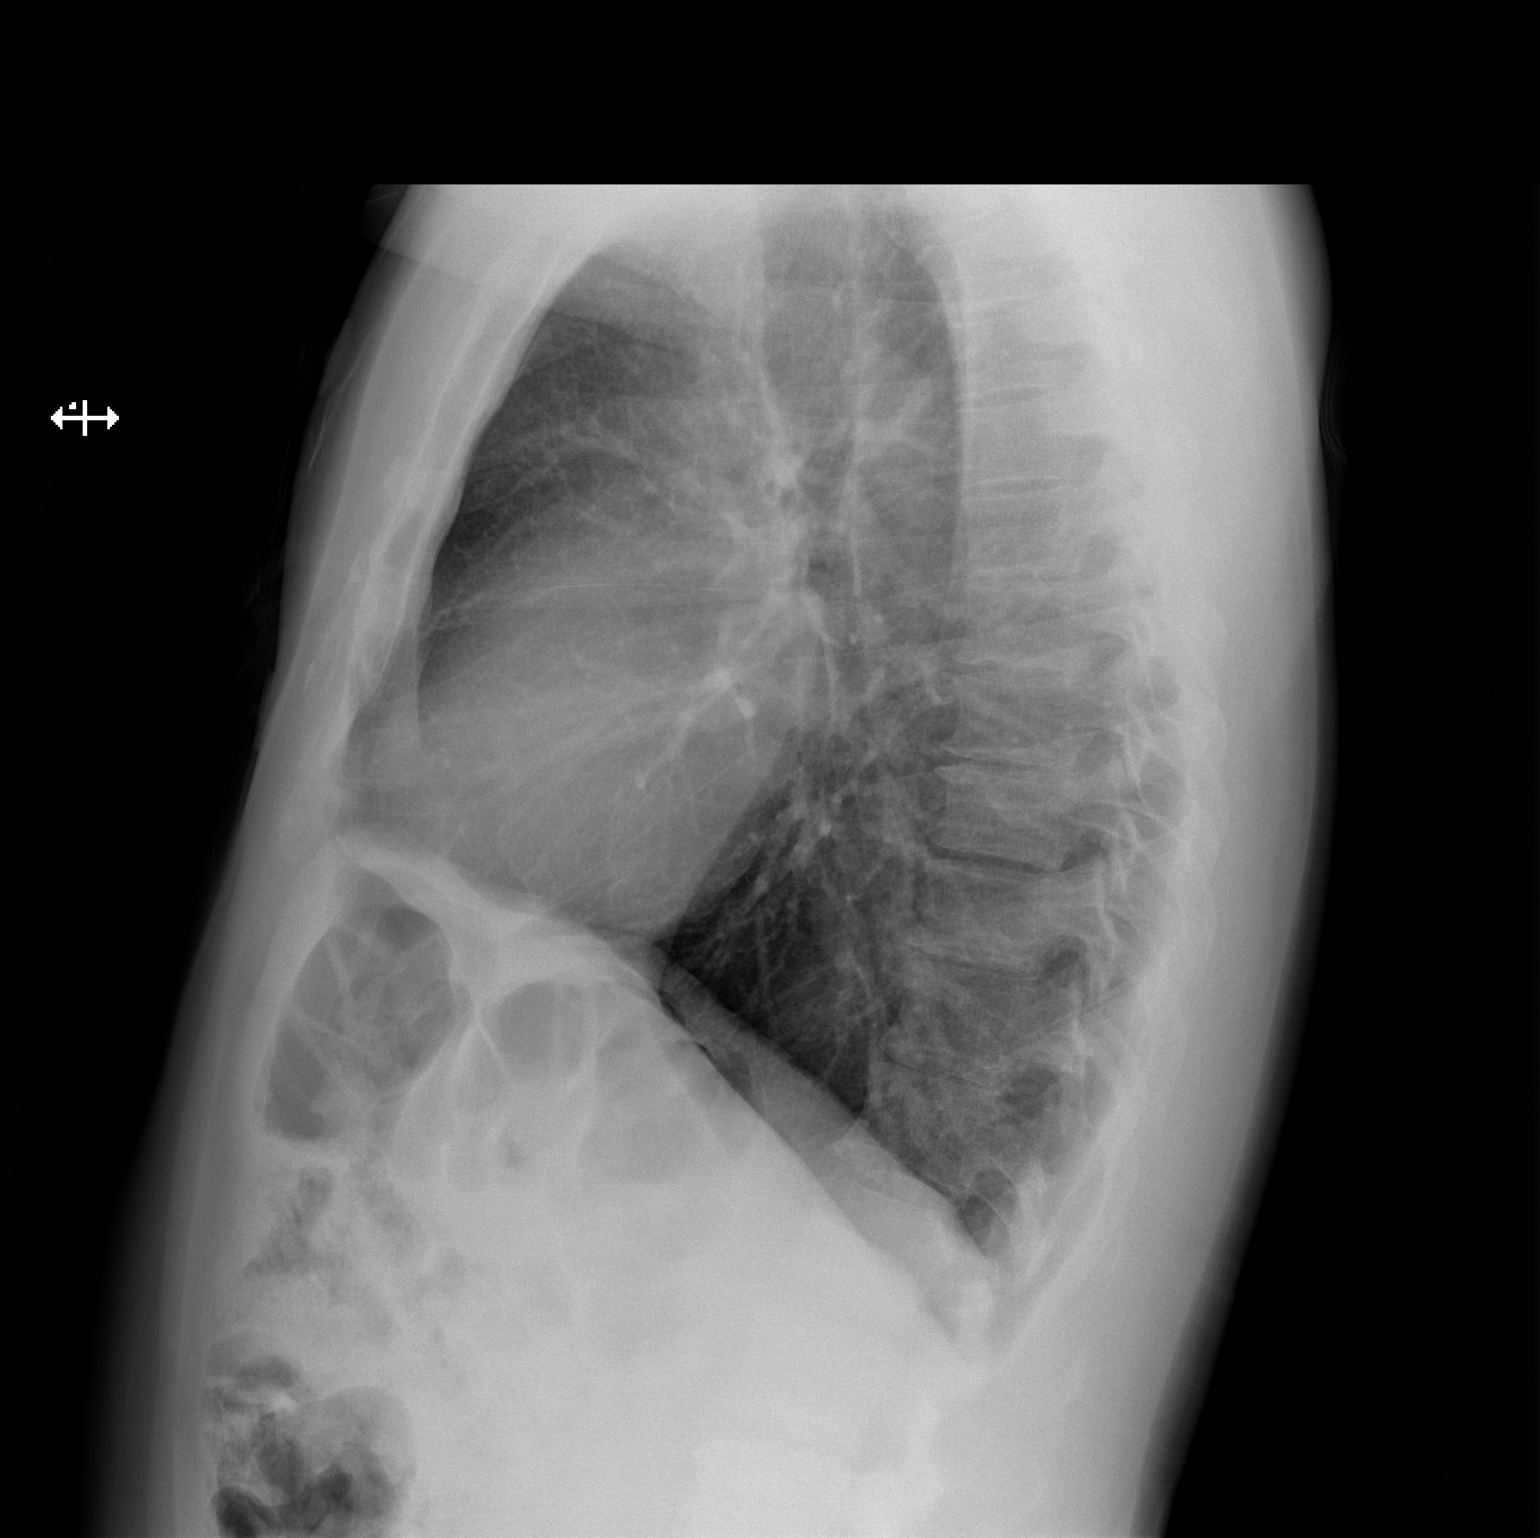

[2 of 2 positions shown; findings below may reference images not displayed]

FINDINGS: The heart size and mediastinal contours are within normal limits.
Both lungs are clear. The visualized skeletal structures are
unremarkable.
IMPRESSION: Negative two view chest x-ray

## 2020-08-20 ENCOUNTER — Emergency Department (HOSPITAL_COMMUNITY)
Admission: EM | Admit: 2020-08-20 | Discharge: 2020-08-21 | Discharge status: Against Medical Advice | Payer: BLUE CROSS/BLUE SHIELD

## 2020-08-21 ENCOUNTER — Encounter (HOSPITAL_COMMUNITY): Payer: Self-pay

## 2020-08-21 ENCOUNTER — Emergency Department (HOSPITAL_COMMUNITY)
Admission: EM | Admit: 2020-08-21 | Discharge: 2020-08-21 | Disposition: A | Discharge status: Home / Self Care | Payer: Self-pay | Attending: Emergency Medicine | Admitting: Emergency Medicine

## 2020-08-21 DIAGNOSIS — Z87891 Personal history of nicotine dependence: Secondary | ICD-10-CM | POA: Insufficient documentation

## 2020-08-21 DIAGNOSIS — R42 Dizziness and giddiness: Secondary | ICD-10-CM | POA: Insufficient documentation

## 2020-08-21 DIAGNOSIS — R001 Bradycardia, unspecified: Secondary | ICD-10-CM | POA: Insufficient documentation

## 2020-08-21 DIAGNOSIS — Z20822 Contact with and (suspected) exposure to covid-19: Secondary | ICD-10-CM | POA: Insufficient documentation

## 2020-08-21 LAB — SARS CORONAVIRUS 2 (TAT 6-24 HRS): SARS Coronavirus 2: NEGATIVE

## 2020-08-21 MED ORDER — MECLIZINE HCL 25 MG PO TABS
25.0000 mg | ORAL_TABLET | Freq: Once | ORAL | Status: AC
  Administered 2020-08-21: 25 mg via ORAL
  Filled 2020-08-21: qty 1

## 2020-08-21 MED ORDER — MECLIZINE HCL 25 MG PO TABS: 25.0000 mg | ORAL_TABLET | Freq: Three times a day (TID) | ORAL | 0 refills | Status: DC | PRN

## 2020-08-21 NOTE — ED Provider Notes (Signed)
[facility]-EMERGENCY DEPT Provider Note: Brandon Dell, MD, FACEP  CSN: [id number] MRN: [id number] ARRIVAL: 08/21/20 at 0102 ROOM: WA15/WA15   CHIEF COMPLAINT  Dizziness   HISTORY OF PRESENT ILLNESS  08/21/20 5:04 AM Brandon Ward is a 45 y.o. male had dizziness for about the past week and a half.  The dizziness occurs usually when he is lying down.  He describes it as a sensation that things are spinning.  He sometimes gets nauseated with this but is not currently having any symptoms.  Symptoms are moderate when they occur.  He denies headache, blurred vision or hearing changes.  He was concerned high blood pressure may be causing this but his blood pressures apart from the initial 1 in triage have been normal.  He is requesting a COVID test.   Past Medical History:  Diagnosis Date  . GERD (gastroesophageal reflux disease)     History reviewed. No pertinent surgical history.  Family History  Problem Relation Age of Onset  . Stomach cancer Neg Hx   . Colon cancer Neg Hx     Social History   Tobacco Use  . Smoking status: Former Smoker    Packs/day: 1.50    Years: 18.00    Pack years: 27.00    Types: Cigarettes    Quit date: 07/31/2014    Years since quitting: 6.0  . Smokeless tobacco: Never Used  Vaping Use  . Vaping Use: Never used  Substance Use Topics  . Alcohol use: Yes    Alcohol/week: 0.0 standard drinks    Comment: sometimes  . Drug use: No    Prior to Admission medications   Medication Sig Start Date End Date Taking? Authorizing Provider  meclizine (ANTIVERT) 25 MG tablet Take 1 tablet (25 mg total) by mouth 3 (three) times daily as needed for dizziness. 08/21/20  Yes Emmanuel Ercole, MD  omeprazole (PRILOSEC) 40 MG capsule Take 1 capsule (40 mg total) by mouth daily. 07/22/17   Hilarie Fredrickson, MD    Allergies Pain & fever [acetaminophen]   REVIEW OF SYSTEMS  Negative except as noted here or in the History of Present Illness.   PHYSICAL EXAMINATION  Initial  Vital Signs Blood pressure 127/89, pulse (!) 49, temperature 98.4 F (36.9 C), temperature source Oral, resp. rate 15, height 5\' 6"  (1.676 m), weight 79.4 kg, SpO2 99 %.  Examination General: Well-developed, well-nourished male in no acute distress; appearance consistent with age of record HENT: normocephalic; atraumatic; TMs normal Eyes: pupils equal, round and reactive to light; extraocular muscles intact; no nystagmus Neck: supple Heart: regular rate and rhythm; no murmurs, rubs or gallops Lungs: clear to auscultation bilaterally Abdomen: soft; nondistended; nontender; bowel sounds present Extremities: No deformity; full range of motion; pulses normal Neurologic: Awake, alert and oriented; motor function intact in all extremities and symmetric; no facial droop Skin: Warm and dry Psychiatric: Normal mood and affect   RESULTS  Summary of this visit's results, reviewed and interpreted by myself:   EKG Interpretation  Date/Time:    Ventricular Rate:    PR Interval:    QRS Duration:   QT Interval:    QTC Calculation:   R Axis:     Text Interpretation:        Laboratory Studies: No results found for this or any previous visit (from the past 24 hour(s)). Imaging Studies: No results found.  ED COURSE and MDM  Nursing notes, initial and subsequent vitals signs, including pulse oximetry, reviewed and interpreted by myself.  Vitals:   08/21/20 0220 08/21/20 0300 08/21/20 0330 08/21/20 0436  BP: 123/89 129/84 131/85 127/89  Pulse: 60 (!) 48 (!) 52 (!) 49  Resp: 18 12 16 15   Temp:      TempSrc:      SpO2: 100% 98% 100% 99%  Weight:      Height:       Medications - No data to display  Symptoms are consistent with peripheral vertigo.  We will try him on meclizine.  He was advised this may cause drowsiness but since his symptoms are usually only present when supine this might be beneficial when he is trying to sleep.  He is noted to be bradycardic but he is not having  symptoms typical of those that occur with bradycardia.  Specifically he is not feeling worse or lightheaded when he stands.  PROCEDURES  Procedures   ED DIAGNOSES     ICD-10-CM   1. Vertigo  R42   2. Sinus bradycardia  R00.1        Brandon Ward, , MD 08/21/20 [phone]

## 2020-08-21 NOTE — ED Triage Notes (Signed)
Pt c/o dizziness x 1.5wks, states it is worse when he is laying down. States this has happened before and was told his BP was high. Denies HA, blurred vision. States work wants him to get a covid test

## 2022-02-21 ENCOUNTER — Emergency Department (HOSPITAL_COMMUNITY): Payer: Commercial Managed Care - HMO

## 2022-02-21 ENCOUNTER — Encounter (HOSPITAL_COMMUNITY): Payer: Self-pay | Admitting: Emergency Medicine

## 2022-02-21 ENCOUNTER — Emergency Department (HOSPITAL_COMMUNITY)
Admission: EM | Admit: 2022-02-21 | Discharge: 2022-02-21 | Disposition: A | Discharge status: Home / Self Care | Payer: Commercial Managed Care - HMO | Attending: Emergency Medicine | Admitting: Emergency Medicine

## 2022-02-21 DIAGNOSIS — R519 Headache, unspecified: Secondary | ICD-10-CM | POA: Insufficient documentation

## 2022-02-21 DIAGNOSIS — R42 Dizziness and giddiness: Secondary | ICD-10-CM | POA: Insufficient documentation

## 2022-02-21 DIAGNOSIS — R112 Nausea with vomiting, unspecified: Secondary | ICD-10-CM | POA: Insufficient documentation

## 2022-02-21 LAB — URINALYSIS, ROUTINE W REFLEX MICROSCOPIC
Bilirubin Urine: NEGATIVE
Glucose, UA: NEGATIVE mg/dL
Hgb urine dipstick: NEGATIVE
Ketones, ur: 20 mg/dL — AB
Leukocytes,Ua: NEGATIVE
Nitrite: NEGATIVE
Protein, ur: NEGATIVE mg/dL
Specific Gravity, Urine: 1.023 (ref 1.005–1.030)
pH: 7 (ref 5.0–8.0)

## 2022-02-21 LAB — CBC WITH DIFFERENTIAL/PLATELET
Abs Immature Granulocytes: 0.02 10*3/uL (ref 0.00–0.07)
Basophils Absolute: 0 10*3/uL (ref 0.0–0.1)
Basophils Relative: 1 %
Eosinophils Absolute: 0.4 10*3/uL (ref 0.0–0.5)
Eosinophils Relative: 5 %
HCT: 47.7 % (ref 39.0–52.0)
Hemoglobin: 16.3 g/dL (ref 13.0–17.0)
Immature Granulocytes: 0 %
Lymphocytes Relative: 24 %
Lymphs Abs: 1.9 10*3/uL (ref 0.7–4.0)
MCH: 32 pg (ref 26.0–34.0)
MCHC: 34.2 g/dL (ref 30.0–36.0)
MCV: 93.7 fL (ref 80.0–100.0)
Monocytes Absolute: 0.7 10*3/uL (ref 0.1–1.0)
Monocytes Relative: 8 %
Neutro Abs: 5.1 10*3/uL (ref 1.7–7.7)
Neutrophils Relative %: 62 %
Platelets: 271 10*3/uL (ref 150–400)
RBC: 5.09 MIL/uL (ref 4.22–5.81)
RDW: 13 % (ref 11.5–15.5)
WBC: 8.1 10*3/uL (ref 4.0–10.5)
nRBC: 0 % (ref 0.0–0.2)

## 2022-02-21 LAB — COMPREHENSIVE METABOLIC PANEL WITH GFR
ALT: 24 U/L (ref 0–44)
AST: 43 U/L — ABNORMAL HIGH (ref 15–41)
Albumin: 4.3 g/dL (ref 3.5–5.0)
Alkaline Phosphatase: 97 U/L (ref 38–126)
Anion gap: 9 (ref 5–15)
BUN: 13 mg/dL (ref 6–20)
CO2: 25 mmol/L (ref 22–32)
Calcium: 9.2 mg/dL (ref 8.9–10.3)
Chloride: 110 mmol/L (ref 98–111)
Creatinine, Ser: 0.99 mg/dL (ref 0.61–1.24)
GFR, Estimated: >60 mL/min
Glucose, Bld: 147 mg/dL — ABNORMAL HIGH (ref 70–99)
Potassium: 3.9 mmol/L (ref 3.5–5.1)
Sodium: 144 mmol/L (ref 135–145)
Total Bilirubin: 0.6 mg/dL (ref 0.3–1.2)
Total Protein: 7.6 g/dL (ref 6.5–8.1)

## 2022-02-21 LAB — LIPASE, BLOOD: Lipase: 27 U/L (ref 11–51)

## 2022-02-21 MED ORDER — MECLIZINE HCL 25 MG PO TABS
25.0000 mg | ORAL_TABLET | Freq: Once | ORAL | Status: AC
  Administered 2022-02-21: 25 mg via ORAL
  Filled 2022-02-21: qty 1

## 2022-02-21 MED ORDER — DIPHENHYDRAMINE HCL 50 MG/ML IJ SOLN
12.5000 mg | Freq: Once | INTRAMUSCULAR | Status: AC
  Administered 2022-02-21: 12.5 mg via INTRAVENOUS
  Filled 2022-02-21: qty 1

## 2022-02-21 MED ORDER — KETOROLAC TROMETHAMINE 15 MG/ML IJ SOLN
15.0000 mg | Freq: Once | INTRAMUSCULAR | Status: AC
  Administered 2022-02-21: 15 mg via INTRAVENOUS
  Filled 2022-02-21: qty 1

## 2022-02-21 MED ORDER — MECLIZINE HCL 25 MG PO TABS: 25.0000 mg | ORAL_TABLET | Freq: Three times a day (TID) | ORAL | 0 refills | Status: AC | PRN

## 2022-02-21 MED ORDER — ONDANSETRON 4 MG PO TBDP: 4.0000 mg | ORAL_TABLET | Freq: Three times a day (TID) | ORAL | 0 refills | Status: AC | PRN

## 2022-02-21 MED ORDER — DIAZEPAM 5 MG/ML IJ SOLN
2.5000 mg | Freq: Once | INTRAMUSCULAR | Status: AC
  Administered 2022-02-21: 2.5 mg via INTRAVENOUS
  Filled 2022-02-21: qty 2

## 2022-02-21 MED ORDER — ONDANSETRON HCL 4 MG/2ML IJ SOLN
4.0000 mg | Freq: Once | INTRAMUSCULAR | Status: AC
  Administered 2022-02-21: 4 mg via INTRAVENOUS
  Filled 2022-02-21: qty 2

## 2022-02-21 MED ORDER — ONDANSETRON 4 MG PO TBDP
4.0000 mg | ORAL_TABLET | Freq: Once | ORAL | Status: AC
  Administered 2022-02-21: 4 mg via ORAL
  Filled 2022-02-21: qty 1

## 2022-02-21 MED ORDER — PROCHLORPERAZINE EDISYLATE 10 MG/2ML IJ SOLN
10.0000 mg | Freq: Once | INTRAMUSCULAR | Status: AC
  Administered 2022-02-21: 10 mg via INTRAVENOUS
  Filled 2022-02-21: qty 2

## 2022-02-21 MED ORDER — SODIUM CHLORIDE 0.9 % IV BOLUS
1000.0000 mL | Freq: Once | INTRAVENOUS | Status: AC
  Administered 2022-02-21: 1000 mL via INTRAVENOUS

## 2022-02-21 NOTE — ED Triage Notes (Signed)
Patient here from home reporting n/v with dizziness. Hx of vertigo.

## 2022-02-21 NOTE — Discharge Instructions (Signed)
Take the medications as prescribed  Return for new or worsening symptoms 

## 2022-02-21 NOTE — ED Provider Notes (Signed)
[facility] DEPT Provider Note   CSN: [id number] Arrival date & time: 02/21/22  1831     History  Chief Complaint  Patient presents with   Emesis   Dizziness    Brandon Ward is a 46 y.o. male for evaluation of dizziness and emesis.  States he was fine when he went to bed last night.  He woke up this morning and when he sat up felt the room was spinning.  This typically occurs whenever he has any sort of movement.  He denies any known head trauma.  No numbness, weakness, facial droop, difficulty with word finding, vision changes.  Has had a mild aching headache however denies any sudden onset thunderclap headache.  Has had multiple episodes of NBNB emesis.  He has had something similar previously.  No fever, neck pain, chest pain, shortness of breath abdominal pain, pain or swelling to extremities.  HPI     Home Medications Prior to Admission medications   Medication Sig Start Date End Date Taking? Authorizing Provider  meclizine (ANTIVERT) 25 MG tablet Take 1 tablet (25 mg total) by mouth 3 (three) times daily as needed for dizziness. 02/21/22  Yes Darrow Barreiro A, PA-C  ondansetron (ZOFRAN-ODT) 4 MG disintegrating tablet Take 1 tablet (4 mg total) by mouth every 8 (eight) hours as needed for nausea or vomiting. 02/21/22  Yes Tareq Dwan A, PA-C  omeprazole (PRILOSEC) 40 MG capsule Take 1 capsule (40 mg total) by mouth daily. 07/22/17   Irene Shipper, MD      Allergies    Pain & fever [acetaminophen]    Review of Systems   Review of Systems  Constitutional: Negative.   HENT: Negative.    Respiratory: Negative.    Cardiovascular: Negative.   Gastrointestinal:  Positive for nausea and vomiting. Negative for abdominal distention, abdominal pain, anal bleeding, blood in stool, constipation, diarrhea and rectal pain.  Genitourinary: Negative.   Musculoskeletal: Negative.   Skin: Negative.   Neurological:  Positive for dizziness and headaches.  Negative for tremors, seizures, syncope, facial asymmetry, speech difficulty, weakness, light-headedness and numbness.  All other systems reviewed and are negative.   Physical Exam Updated Vital Signs BP (!) 153/86   Pulse 75   Temp 98.3 F (36.8 C) (Oral)   Resp 20   SpO2 98%  Physical Exam Physical Exam  Constitutional: Pt is oriented to person, place, and time. Pt appears well-developed and well-nourished. No distress.  HENT:  Head: Normocephalic and atraumatic.  Mouth/Throat: Oropharynx is clear and moist.  Eyes: Conjunctivae and EOM are normal. Pupils are equal, round, and reactive to light. No scleral icterus.  1 beat horizontal nystagmus to left Neck: Normal range of motion. Neck supple.  Full active and passive ROM without pain No midline or paraspinal tenderness No nuchal rigidity or meningeal signs  Cardiovascular: Normal rate, regular rhythm and intact distal pulses.   Pulmonary/Chest: Effort normal and breath sounds normal. No respiratory distress. Pt has no wheezes. No rales.  Abdominal: Soft. Bowel sounds are normal. There is no tenderness. There is no rebound and no guarding.  Musculoskeletal: Normal range of motion.  Lymphadenopathy:    No cervical adenopathy.  Neurological: Pt. is alert and oriented to person, place, and time. He has normal reflexes. No cranial nerve deficit.  Exhibits normal muscle tone. Coordination normal.  Mental Status:  Alert, oriented, thought content appropriate. Speech fluent without evidence of aphasia. Able to follow 2 step commands without difficulty.  Cranial Nerves:  II:  Peripheral visual fields grossly normal, pupils equal, round, reactive to light III,IV, VI: ptosis not present, extra-ocular motions intact bilaterally  V,VII: smile symmetric, facial light touch sensation equal VIII: hearing grossly normal bilaterally  IX,X: midline uvula rise  XI: bilateral shoulder shrug equal and strong XII: midline tongue extension  Motor:   5/5 in upper and lower extremities bilaterally including strong and equal grip strength and dorsiflexion/plantar flexion Sensory: Pinprick and light touch normal in all extremities.  Deep Tendon Reflexes: 2+ and symmetric  Cerebellar: normal finger-to-nose with bilateral upper extremities Gait: normal gait and balance CV: distal pulses palpable throughout   Skin: Skin is warm and dry. No rash noted. Pt is not diaphoretic.  Psychiatric: Pt has a normal mood and affect. Behavior is normal. Judgment and thought content normal.  Nursing note and vitals reviewed.  ED Results / Procedures / Treatments   Labs (all labs ordered are listed, but only abnormal results are displayed) Labs Reviewed  COMPREHENSIVE METABOLIC PANEL - Abnormal; Notable for the following components:      Result Value   Glucose, Bld 147 (*)    AST 43 (*)    All other components within normal limits  URINALYSIS, ROUTINE W REFLEX MICROSCOPIC - Abnormal; Notable for the following components:   Ketones, ur 20 (*)    All other components within normal limits  CBC WITH DIFFERENTIAL/PLATELET  LIPASE, BLOOD    EKG EKG Interpretation  Date/Time:  Thursday February 21 2022 19:52:41 EST Ventricular Rate:  58 PR Interval:  159 QRS Duration: 91 QT Interval:  415 QTC Calculation: 408 R Axis:   56 Text Interpretation: Sinus rhythm No significant change was found Confirmed by Ezequiel Essex [phone]) on 02/21/2022 7:54:53 PM  Radiology CT HEAD WO CONTRAST (5MM)  Result Date: 02/21/2022 CLINICAL DATA:  Syncope/presyncope, cerebrovascular cause suspected headache, dizziness EXAM: CT HEAD WITHOUT CONTRAST TECHNIQUE: Contiguous axial images were obtained from the base of the skull through the vertex without intravenous contrast. RADIATION DOSE REDUCTION: This exam was performed according to the departmental dose-optimization program which includes automated exposure control, adjustment of the mA and/or kV according to patient  size and/or use of iterative reconstruction technique. COMPARISON:  None Available. FINDINGS: Brain: No evidence of large-territorial acute infarction. No parenchymal hemorrhage. No mass lesion. No extra-axial collection. No mass effect or midline shift. No hydrocephalus. Basilar cisterns are patent. Vascular: No hyperdense vessel. Skull: No acute fracture or focal lesion. Sinuses/Orbits: Mild mucosal thickening of bilateral maxillary and ethmoid sinuses. Otherwise paranasal sinuses and mastoid air cells are clear. The orbits are unremarkable. Other: None. IMPRESSION: No acute intracranial abnormality. Electronically Signed   By: Iven Finn M.D.   On: 02/21/2022 21:04    Procedures Procedures    Medications Ordered in ED Medications  ondansetron (ZOFRAN-ODT) disintegrating tablet 4 mg (4 mg Oral Given 02/21/22 1925)  meclizine (ANTIVERT) tablet 25 mg (25 mg Oral Given 02/21/22 1925)  ondansetron (ZOFRAN) injection 4 mg (4 mg Intravenous Given 02/21/22 1944)  sodium chloride 0.9 % bolus 1,000 mL (0 mLs Intravenous Stopped 02/21/22 2036)  diazepam (VALIUM) injection 2.5 mg (2.5 mg Intravenous Given 02/21/22 1944)  prochlorperazine (COMPAZINE) injection 10 mg (10 mg Intravenous Given 02/21/22 2132)  diphenhydrAMINE (BENADRYL) injection 12.5 mg (12.5 mg Intravenous Given 02/21/22 2129)  ketorolac (TORADOL) 15 MG/ML injection 15 mg (15 mg Intravenous Given 02/21/22 2131)    ED Course/ Medical Decision Making/ A&P    46 year old here for evaluation of dizziness and emesis which began  this morning.  Occurs when he moves primarily.  Does admit to a very mild headache.  Some onset" headache.  He has a nonfocal neuroexam without deficits.  No abdominal pain, chest pain.  Will plan on labs, imaging, treat symptomatically and reassess  Per nursing patient had immediate emesis after taking the p.o. meclizine.  Will attempt IV Valium and additional antiemetics.  Labs and imaging personally viewed and  interpreted:  CBC without leukocytosis CMP glucose 147 Lipase 27 Ct head without CVA, bleed, mass EKG without ischemic changes, heart block  Patient reassessed.  Has had very minimal improvement in his dizziness.  States he still has a headache.  Will give migraine cocktail.  He continues to have nonfocal neuroexam without deficit.  CT head reassuring.  Patient reassessed after migraine cocktail.  Symptoms completely resolved.  He continues have a nonfocal neuroexam without deficit.  He is tolerating p.o. intake.  Will DC home with symptomatic management.  He will return for new or worsening symptoms.  At this time I have low suspicion for posterior circulation CVA, infectious process, dissection, bleed as cause of his symptoms.  The patient has been appropriately medically screened and/or stabilized in the ED. I have low suspicion for any other emergent medical condition which would require further screening, evaluation or treatment in the ED or require inpatient management.  Patient is hemodynamically stable and in no acute distress.  Patient able to ambulate in department prior to ED.  Evaluation does not show acute pathology that would require ongoing or additional emergent interventions while in the emergency department or further inpatient treatment.  I have discussed the diagnosis with the patient and answered all questions.  Pain is been managed while in the emergency department and patient has no further complaints prior to discharge.  Patient is comfortable with plan discussed in room and is stable for discharge at this time.  I have discussed strict return precautions for returning to the emergency department.  Patient was encouraged to follow-up with PCP/specialist refer to at discharge.                            Medical Decision Making Amount and/or Complexity of Data Reviewed External Data Reviewed: labs, radiology, ECG and notes. Labs: ordered. Decision-making details documented  in ED Course. Radiology: ordered and independent interpretation performed. Decision-making details documented in ED Course. ECG/medicine tests: ordered and independent interpretation performed. Decision-making details documented in ED Course.  Risk OTC drugs. Prescription drug management. Parenteral controlled substances. Decision regarding hospitalization. Diagnosis or treatment significantly limited by social determinants of health.          Final Clinical Impression(s) / ED Diagnoses Final diagnoses:  Acute nonintractable headache, unspecified headache type  Vertigo    Rx / DC Orders ED Discharge Orders          Ordered    meclizine (ANTIVERT) 25 MG tablet  3 times daily PRN        02/21/22 2230    ondansetron (ZOFRAN-ODT) 4 MG disintegrating tablet  Every 8 hours PRN        02/21/22 2230              Aidenn Skellenger A, PA-C 02/21/22 2234    Glynn Octave, MD 02/21/22 2309

## 2022-09-26 ENCOUNTER — Ambulatory Visit: Payer: BLUE CROSS/BLUE SHIELD | Admitting: Physician Assistant

## 2022-09-26 ENCOUNTER — Encounter: Payer: Self-pay | Admitting: Physician Assistant

## 2022-09-26 VITALS — BP 138/89 | HR 72 | Temp 97.3°F | Ht 67.0 in | Wt 148.0 lb

## 2022-09-26 DIAGNOSIS — S41002D Unspecified open wound of left shoulder, subsequent encounter: Secondary | ICD-10-CM | POA: Diagnosis not present

## 2022-09-26 DIAGNOSIS — S40922D Unspecified superficial injury of left upper arm, subsequent encounter: Secondary | ICD-10-CM

## 2022-09-26 DIAGNOSIS — W503XXA Accidental bite by another person, initial encounter: Secondary | ICD-10-CM

## 2022-09-26 DIAGNOSIS — M545 Low back pain, unspecified: Secondary | ICD-10-CM | POA: Diagnosis not present

## 2022-09-26 MED ORDER — AMOXICILLIN-POT CLAVULANATE 875-125 MG PO TABS: 1.0000 | ORAL_TABLET | Freq: Two times a day (BID) | ORAL | 0 refills | Status: AC
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
## 2022-09-26 NOTE — Patient Instructions (Addendum)
You are going to take Augmentin twice a day for 7 days.  For your back pain, I do encourage you to start doing stretching on a daily basis, make sure you are drinking plenty of water.  You can also use Tylenol or ibuprofen over-the-counter as needed.  Please feel free to return to the mobile unit as needed.  Brandon Jaffe, PA-C Physician Assistant [facility] [email or web address]  Human Bite Human bite wounds tend to become infected, even when they seem minor at first. Infection can develop quickly, sometimes in a matter of hours. Bite wounds of the hand have a higher chance of infection compared to bites in other places and can be serious because the tendons and joints are close to the skin. What are the causes? The condition is caused when one person bites another person. It may also occur when the closed fist of one person makes contact with the teeth of another person. What are the signs or symptoms? Common symptoms of a human bite include: Bruising. Broken skin. Bleeding. Pain. How is this diagnosed? This condition may be diagnosed based on a physical exam, medical history, or tests. Your health care provider will examine the wound and ask for details about how the bite happened. If you have details about the medical history of the person who bit you, it is important to tell your health care provider. This will help determine if there is any chance that a disease may have been spread. You may have tests, such as: Blood tests. This may be done if there is a chance of infection from diseases such as hepatitis or human immunodeficiency virus (HIV). X-rays to check for damage to bones or joints. Culture test. This uses a sample of fluid from the wound to check for infection. How is this treated? Treatment depends on the location and severity of the bite and medical history. The main treatment for bite wounds is extensive cleaning.  The wound should be cleaned right away with a mild antibacterial soap and water. Your health care provider may flush the wound with saline solution and ensure there are no foreign bodies left in the wound. Further treatment of the wound may include: Applying a bandage (dressing). Leaving the wound open to heal because of the high risk of infection. In some cases, the wound may be closed with stitches (sutures), staples, skin glue, or adhesive strips. Taking an antibiotic medicine to prevent or treat an infection. Having a tetanus shot. In some cases, bites that have become infected may need: Antibiotics through an IV tube. Surgical treatment. Follow these instructions at home: Wound care Follow instructions from your health care provider about how to take care of your wound. Make sure you: Wash your hands with soap and water for at least 20 seconds before and after you change your bandage (dressing). If soap and water are not available, use hand sanitizer. Change your dressing as told by your health care provider. Leave stitches (sutures), skin glue, or adhesive strips in place. These skin closures may need to stay in place for 2 weeks or longer. If adhesive strip edges start to loosen and curl up, you may trim the loose edges. Do not remove adhesive strips completely unless your health care provider tells you to do that. Check your wound every day for signs of infection. Check for: More redness, swelling, or pain. More fluid or blood. Warmth. Pus or a bad smell. Managing pain, stiffness, and swelling  If directed, put ice  on the injured area. To do this: Put ice in a plastic bag. Place a towel between your skin and the bag. Leave the ice on for 20 minutes, 2-3 times a day. If your skin turns bright red, remove the ice right away to prevent skin damage. The risk of skin damage is higher if you cannot feel pain, heat, or cold. Raise (elevate) the injured area above the level of your heart  while you are sitting or lying down. General instructions Take or apply over-the-counter and prescription medicines as told by your health care provider. If you were prescribed antibiotics, take or apply them as told by your health care provider. Do not stop using the antibiotic even if you start to feel better. Keep all follow-up visits. Your health care provider will monitor your wound healing. Contact a health care provider if: You have increased pain or difficulty when moving your injured area. Your wound has signs of infection, such as: More redness, swelling, or pain. More fluid or blood. Warmth. Pus or a bad smell. You have chills or a fever. You are not improving, or you are getting worse. Get help right away if: You have redness, swelling, or pain at the site of your wound that is rapidly getting worse. You have a red streak extending away from your wound. Summary Human bite wounds tend to become infected, even when they seem minor at first. Infection can develop quickly, sometimes in a matter of hours. Treatment depends on the location and severity of the bite and medical history. Follow instructions from your health care provider about how to take care of your wound. Contact a health care provider if you have signs of infection. This information is not intended to replace advice given to you by your health care provider. Make sure you discuss any questions you have with your health care provider. Document Revised: 06/13/2021 Document Reviewed: 06/13/2021 Elsevier Patient Education  2024 [employer]  Acute Back Pain, Adult Acute back pain is sudden and usually short-lived. It is often caused by an injury to the muscles and tissues in the back. The injury may result from: A muscle, tendon, or ligament getting overstretched or torn. Ligaments are tissues that connect bones to each other. Lifting something improperly can cause a back strain. Wear and tear (degeneration) of the  spinal disks. Spinal disks are circular tissue that provide cushioning between the bones of the spine (vertebrae). Twisting motions, such as while playing sports or doing yard work. A hit to the back. Arthritis. You may have a physical exam, lab tests, and imaging tests to find the cause of your pain. Acute back pain usually goes away with rest and home care. Follow these instructions at home: Managing pain, stiffness, and swelling Take over-the-counter and prescription medicines only as told by your health care provider. Treatment may include medicines for pain and inflammation that are taken by mouth or applied to the skin, or muscle relaxants. Your health care provider may recommend applying ice during the first 24-48 hours after your pain starts. To do this: Put ice in a plastic bag. Place a towel between your skin and the bag. Leave the ice on for 20 minutes, 2-3 times a day. Remove the ice if your skin turns bright red. This is very important. If you cannot feel pain, heat, or cold, you have a greater risk of damage to the area. If directed, apply heat to the affected area as often as told by your health care provider.  Use the heat source that your health care provider recommends, such as a moist heat pack or a heating pad. Place a towel between your skin and the heat source. Leave the heat on for 20-30 minutes. Remove the heat if your skin turns bright red. This is especially important if you are unable to feel pain, heat, or cold. You have a greater risk of getting burned. Activity  Do not stay in bed. Staying in bed for more than 1-2 days can delay your recovery. Sit up and stand up straight. Avoid leaning forward when you sit or hunching over when you stand. If you work at a desk, sit close to it so you do not need to lean over. Keep your chin tucked in. Keep your neck drawn back, and keep your elbows bent at a 90-degree angle (right angle). Sit high and close to the steering wheel  when you drive. Add lower back (lumbar) support to your car seat, if needed. Take short walks on even surfaces as soon as you are able. Try to increase the length of time you walk each day. Do not sit, drive, or stand in one place for more than 30 minutes at a time. Sitting or standing for long periods of time can put stress on your back. Do not drive or use heavy machinery while taking prescription pain medicine. Use proper lifting techniques. When you bend and lift, use positions that put less stress on your back: [place] your knees. Keep the load close to your body. Avoid twisting. Exercise regularly as told by your health care provider. Exercising helps your back heal faster and helps prevent back injuries by keeping muscles strong and flexible. Work with a physical therapist to make a safe exercise program, as recommended by your health care provider. Do any exercises as told by your physical therapist. Lifestyle Maintain a healthy weight. Extra weight puts stress on your back and makes it difficult to have good posture. Avoid activities or situations that make you feel anxious or stressed. Stress and anxiety increase muscle tension and can make back pain worse. Learn ways to manage anxiety and stress, such as through exercise. General instructions Sleep on a firm mattress in a comfortable position. Try lying on your side with your knees slightly bent. If you lie on your back, put a pillow under your knees. Keep your head and neck in a straight line with your spine (neutral position) when using electronic equipment like smartphones or pads. To do this: Raise your smartphone or pad to look at it instead of bending your head or neck to look down. Put the smartphone or pad at the level of your face while looking at the screen. Follow your treatment plan as told by your health care provider. This may include: Cognitive or behavioral therapy. Acupuncture or massage therapy. Meditation or  yoga. Contact a health care provider if: You have pain that is not relieved with rest or medicine. You have increasing pain going down into your legs or buttocks. Your pain does not improve after 2 weeks. You have pain at night. You lose weight without trying. You have a fever or chills. You develop nausea or vomiting. You develop abdominal pain. Get help right away if: You develop new bowel or bladder control problems. You have unusual weakness or numbness in your arms or legs. You feel faint. These symptoms may represent a serious problem that is an emergency. Do not wait to see if the symptoms will go away. Get  medical help right away. Call your local emergency services (911 in the [place] Do not drive yourself to the hospital. Summary Acute back pain is sudden and usually short-lived. Use proper lifting techniques. When you bend and lift, use positions that put less stress on your back. Take over-the-counter and prescription medicines only as told by your health care provider, and apply heat or ice as told. This information is not intended to replace advice given to you by your health care provider. Make sure you discuss any questions you have with your health care provider. Document Revised: 06/09/2020 Document Reviewed: 06/09/2020 Elsevier Patient Education  2024 [employer].

## 2022-12-27 ENCOUNTER — Emergency Department (HOSPITAL_COMMUNITY)
Admission: EM | Admit: 2022-12-27 | Discharge: 2022-12-28 | Disposition: A | Discharge status: Home / Self Care | Payer: BLUE CROSS/BLUE SHIELD | Attending: Emergency Medicine | Admitting: Emergency Medicine

## 2022-12-27 ENCOUNTER — Other Ambulatory Visit: Payer: Self-pay

## 2022-12-27 ENCOUNTER — Encounter (HOSPITAL_COMMUNITY): Payer: Self-pay | Admitting: *Deleted

## 2022-12-27 DIAGNOSIS — Y990 Civilian activity done for income or pay: Secondary | ICD-10-CM | POA: Insufficient documentation

## 2022-12-27 DIAGNOSIS — S8992XS Unspecified injury of left lower leg, sequela: Secondary | ICD-10-CM | POA: Diagnosis not present

## 2022-12-27 DIAGNOSIS — S01111A Laceration without foreign body of right eyelid and periocular area, initial encounter: Secondary | ICD-10-CM | POA: Insufficient documentation

## 2022-12-27 DIAGNOSIS — S8992XA Unspecified injury of left lower leg, initial encounter: Secondary | ICD-10-CM | POA: Insufficient documentation

## 2022-12-27 DIAGNOSIS — S0180XS Unspecified open wound of other part of head, sequela: Secondary | ICD-10-CM | POA: Insufficient documentation

## 2022-12-27 DIAGNOSIS — S0990XA Unspecified injury of head, initial encounter: Secondary | ICD-10-CM | POA: Diagnosis present

## 2022-12-27 DIAGNOSIS — Z23 Encounter for immunization: Secondary | ICD-10-CM | POA: Insufficient documentation
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
## 2022-12-28 ENCOUNTER — Emergency Department (HOSPITAL_COMMUNITY): Payer: BLUE CROSS/BLUE SHIELD

## 2022-12-28 MED ORDER — IBUPROFEN 800 MG PO TABS
800.0000 mg | ORAL_TABLET | Freq: Once | ORAL | Status: AC
  Administered 2022-12-28: 800 mg via ORAL
  Filled 2022-12-28: qty 1

## 2022-12-28 MED ORDER — CEPHALEXIN 250 MG PO CAPS
500.0000 mg | ORAL_CAPSULE | Freq: Once | ORAL | Status: AC
  Administered 2022-12-28: 500 mg via ORAL
  Filled 2022-12-28: qty 2

## 2022-12-28 MED ORDER — CEPHALEXIN 500 MG PO CAPS: 500.0000 mg | ORAL_CAPSULE | Freq: Four times a day (QID) | ORAL | 0 refills | Status: DC

## 2022-12-28 MED ORDER — TETANUS-DIPHTH-ACELL PERTUSSIS 5-2.5-18.5 LF-MCG/0.5 IM SUSY
0.5000 mL | PREFILLED_SYRINGE | Freq: Once | INTRAMUSCULAR | Status: AC
  Administered 2022-12-28: 0.5 mL via INTRAMUSCULAR
  Filled 2022-12-28: qty 0.5

## 2022-12-28 MED ORDER — IBUPROFEN 800 MG PO TABS: 800.0000 mg | ORAL_TABLET | Freq: Three times a day (TID) | ORAL | 0 refills | Status: AC
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
## 2022-12-28 NOTE — ED Notes (Signed)
Patient transported to CT 

## 2023-01-03 ENCOUNTER — Emergency Department (HOSPITAL_COMMUNITY): Payer: BLUE CROSS/BLUE SHIELD | Admitting: Critical Care Medicine

## 2023-01-03 ENCOUNTER — Encounter (HOSPITAL_COMMUNITY): Payer: Self-pay | Admitting: Critical Care Medicine

## 2023-01-03 ENCOUNTER — Other Ambulatory Visit: Payer: Self-pay

## 2023-01-03 ENCOUNTER — Encounter (HOSPITAL_COMMUNITY): Admission: EM | Disposition: A | Discharge status: Home / Self Care | Payer: Self-pay | Source: Home / Self Care

## 2023-01-03 ENCOUNTER — Inpatient Hospital Stay (HOSPITAL_COMMUNITY)
Admission: EM | Admit: 2023-01-03 | Discharge: 2023-01-08 | DRG: 981 | Disposition: A | Discharge status: Home / Self Care | Payer: BLUE CROSS/BLUE SHIELD | Attending: General Surgery | Admitting: General Surgery

## 2023-01-03 ENCOUNTER — Emergency Department (HOSPITAL_COMMUNITY): Payer: BLUE CROSS/BLUE SHIELD

## 2023-01-03 DIAGNOSIS — W3400XA Accidental discharge from unspecified firearms or gun, initial encounter: Principal | ICD-10-CM

## 2023-01-03 DIAGNOSIS — S32302B Unspecified fracture of left ilium, initial encounter for open fracture: Secondary | ICD-10-CM | POA: Diagnosis present

## 2023-01-03 DIAGNOSIS — S36892A Contusion of other intra-abdominal organs, initial encounter: Secondary | ICD-10-CM | POA: Diagnosis present

## 2023-01-03 DIAGNOSIS — K658 Other peritonitis: Secondary | ICD-10-CM | POA: Diagnosis present

## 2023-01-03 DIAGNOSIS — Z23 Encounter for immunization: Secondary | ICD-10-CM | POA: Diagnosis not present

## 2023-01-03 DIAGNOSIS — K222 Esophageal obstruction: Secondary | ICD-10-CM | POA: Diagnosis present

## 2023-01-03 DIAGNOSIS — R109 Unspecified abdominal pain: Secondary | ICD-10-CM | POA: Diagnosis present

## 2023-01-03 DIAGNOSIS — S36498A Other injury of other part of small intestine, initial encounter: Principal | ICD-10-CM | POA: Diagnosis present

## 2023-01-03 HISTORY — PX: BOWEL RESECTION: SHX1257

## 2023-01-03 HISTORY — PX: LAPAROTOMY: SHX154

## 2023-01-03 HISTORY — PX: FOREIGN BODY REMOVAL: SHX962

## 2023-01-03 LAB — URINALYSIS, ROUTINE W REFLEX MICROSCOPIC
Bilirubin Urine: NEGATIVE
Glucose, UA: NEGATIVE mg/dL
Ketones, ur: 5 mg/dL — AB
Leukocytes,Ua: NEGATIVE
Nitrite: NEGATIVE
Protein, ur: 30 mg/dL — AB
RBC / HPF: >50 RBC/hpf (ref 0–5)
Specific Gravity, Urine: >1.046 — ABNORMAL HIGH (ref 1.005–1.030)
pH: 5 (ref 5.0–8.0)

## 2023-01-03 LAB — SAMPLE TO BLOOD BANK

## 2023-01-03 LAB — I-STAT CHEM 8, ED
BUN: 21 mg/dL — ABNORMAL HIGH (ref 6–20)
Calcium, Ion: 1.12 mmol/L — ABNORMAL LOW (ref 1.15–1.40)
Chloride: 105 mmol/L (ref 98–111)
Creatinine, Ser: 0.9 mg/dL (ref 0.61–1.24)
Glucose, Bld: 151 mg/dL — ABNORMAL HIGH (ref 70–99)
HCT: 39 % (ref 39.0–52.0)
Hemoglobin: 13.3 g/dL (ref 13.0–17.0)
Potassium: 4.2 mmol/L (ref 3.5–5.1)
Sodium: 139 mmol/L (ref 135–145)
TCO2: 23 mmol/L (ref 22–32)

## 2023-01-03 LAB — COMPREHENSIVE METABOLIC PANEL
ALT: 29 U/L (ref 0–44)
AST: 30 U/L (ref 15–41)
Albumin: 3.7 g/dL (ref 3.5–5.0)
Alkaline Phosphatase: 81 U/L (ref 38–126)
Anion gap: 11 (ref 5–15)
BUN: 18 mg/dL (ref 6–20)
CO2: 20 mmol/L — ABNORMAL LOW (ref 22–32)
Calcium: 8.7 mg/dL — ABNORMAL LOW (ref 8.9–10.3)
Chloride: 103 mmol/L (ref 98–111)
Creatinine, Ser: 0.99 mg/dL (ref 0.61–1.24)
GFR, Estimated: >60 mL/min (ref >60)
Glucose, Bld: 153 mg/dL — ABNORMAL HIGH (ref 70–99)
Potassium: 4 mmol/L (ref 3.5–5.1)
Sodium: 134 mmol/L — ABNORMAL LOW (ref 135–145)
Total Bilirubin: 1 mg/dL (ref 0.3–1.2)
Total Protein: 6.5 g/dL (ref 6.5–8.1)

## 2023-01-03 LAB — CBC
HCT: 39.1 % (ref 39.0–52.0)
Hemoglobin: 12.9 g/dL — ABNORMAL LOW (ref 13.0–17.0)
MCH: 30 pg (ref 26.0–34.0)
MCHC: 33 g/dL (ref 30.0–36.0)
MCV: 90.9 fL (ref 80.0–100.0)
Platelets: 225 10*3/uL (ref 150–400)
RBC: 4.3 MIL/uL (ref 4.22–5.81)
RDW: 12.9 % (ref 11.5–15.5)
WBC: 6.3 10*3/uL (ref 4.0–10.5)
nRBC: 0 % (ref 0.0–0.2)

## 2023-01-03 LAB — PROTIME-INR
INR: 1 (ref 0.8–1.2)
Prothrombin Time: 13.8 s (ref 11.4–15.2)

## 2023-01-03 LAB — POCT I-STAT 7, (LYTES, BLD GAS, ICA,H+H)
Acid-base deficit: 4 mmol/L — ABNORMAL HIGH (ref 0.0–2.0)
Bicarbonate: 21.5 mmol/L (ref 20.0–28.0)
Calcium, Ion: 1.19 mmol/L (ref 1.15–1.40)
HCT: 36 % — ABNORMAL LOW (ref 39.0–52.0)
Hemoglobin: 12.2 g/dL — ABNORMAL LOW (ref 13.0–17.0)
O2 Saturation: 100 %
Potassium: 3.9 mmol/L (ref 3.5–5.1)
Sodium: 138 mmol/L (ref 135–145)
TCO2: 23 mmol/L (ref 22–32)
pCO2 arterial: 38.4 mmHg (ref 32–48)
pH, Arterial: 7.357 (ref 7.35–7.45)
pO2, Arterial: 558 mmHg — ABNORMAL HIGH (ref 83–108)

## 2023-01-03 LAB — I-STAT CG4 LACTIC ACID, ED: Lactic Acid, Venous: 1.9 mmol/L (ref 0.5–1.9)

## 2023-01-03 LAB — ETHANOL: Alcohol, Ethyl (B): <10 mg/dL (ref <10)

## 2023-01-03 SURGERY — LAPAROTOMY, EXPLORATORY
Anesthesia: General | Site: Abdomen

## 2023-01-03 MED ORDER — CEFAZOLIN SODIUM-DEXTROSE 2-3 GM-%(50ML) IV SOLR
INTRAVENOUS | Status: DC | PRN
Start: 2023-01-03 — End: 2023-01-03
  Administered 2023-01-03: 2 g via INTRAVENOUS

## 2023-01-03 MED ORDER — OXYCODONE HCL 5 MG PO TABS
5.0000 mg | ORAL_TABLET | ORAL | Status: DC | PRN
Start: 2023-01-03 — End: 2023-01-03

## 2023-01-03 MED ORDER — ACETAMINOPHEN 10 MG/ML IV SOLN
INTRAVENOUS | Status: DC | PRN
Start: 2023-01-03 — End: 2023-01-03
  Administered 2023-01-03: 1000 mg via INTRAVENOUS

## 2023-01-03 MED ORDER — MEPERIDINE HCL 25 MG/ML IJ SOLN: 6.2500 mg | INTRAMUSCULAR | Status: DC | PRN

## 2023-01-03 MED ORDER — HYDROMORPHONE HCL 1 MG/ML IJ SOLN
INTRAMUSCULAR | Status: DC | PRN
  Administered 2023-01-03: .5 mg via INTRAVENOUS

## 2023-01-03 MED ORDER — HYDROMORPHONE HCL 1 MG/ML IJ SOLN
INTRAMUSCULAR | Status: AC
  Filled 2023-01-03: qty 0.5

## 2023-01-03 MED ORDER — METOPROLOL TARTRATE 5 MG/5ML IV SOLN: 5.0000 mg | Freq: Four times a day (QID) | INTRAVENOUS | Status: DC | PRN

## 2023-01-03 MED ORDER — POLYETHYLENE GLYCOL 3350 17 G PO PACK: 17.0000 g | PACK | Freq: Every day | ORAL | Status: DC | PRN

## 2023-01-03 MED ORDER — PROPOFOL 10 MG/ML IV BOLUS
INTRAVENOUS | Status: DC | PRN
  Administered 2023-01-03: 160 mg via INTRAVENOUS

## 2023-01-03 MED ORDER — ONDANSETRON HCL 4 MG/2ML IJ SOLN: 4.0000 mg | Freq: Four times a day (QID) | INTRAMUSCULAR | Status: DC | PRN

## 2023-01-03 MED ORDER — PROPOFOL 10 MG/ML IV BOLUS
INTRAVENOUS | Status: AC
  Filled 2023-01-03: qty 20

## 2023-01-03 MED ORDER — IOHEXOL 350 MG/ML SOLN
75.0000 mL | Freq: Once | INTRAVENOUS | Status: AC | PRN
  Administered 2023-01-03: 75 mL via INTRAVENOUS

## 2023-01-03 MED ORDER — MIDAZOLAM HCL 2 MG/2ML IJ SOLN: 0.5000 mg | Freq: Once | INTRAMUSCULAR | Status: DC | PRN

## 2023-01-03 MED ORDER — ACETAMINOPHEN 10 MG/ML IV SOLN
INTRAVENOUS | Status: AC
  Filled 2023-01-03: qty 100

## 2023-01-03 MED ORDER — ONDANSETRON 4 MG PO TBDP: 4.0000 mg | ORAL_TABLET | Freq: Four times a day (QID) | ORAL | Status: DC | PRN

## 2023-01-03 MED ORDER — FENTANYL CITRATE (PF) 250 MCG/5ML IJ SOLN
INTRAMUSCULAR | Status: DC | PRN
  Administered 2023-01-03 (×4): 50 ug via INTRAVENOUS
  Administered 2023-01-03 (×2): 25 ug via INTRAVENOUS

## 2023-01-03 MED ORDER — DEXAMETHASONE SODIUM PHOSPHATE 10 MG/ML IJ SOLN
INTRAMUSCULAR | Status: DC | PRN
  Administered 2023-01-03: 4 mg via INTRAVENOUS

## 2023-01-03 MED ORDER — KETOROLAC TROMETHAMINE 15 MG/ML IJ SOLN
30.0000 mg | Freq: Four times a day (QID) | INTRAMUSCULAR | Status: DC
  Administered 2023-01-03 – 2023-01-07 (×16): 30 mg via INTRAVENOUS
  Filled 2023-01-03 (×17): qty 2

## 2023-01-03 MED ORDER — 0.9 % SODIUM CHLORIDE (POUR BTL) OPTIME
TOPICAL | Status: DC | PRN
  Administered 2023-01-03 (×3): 1000 mL

## 2023-01-03 MED ORDER — MIDAZOLAM HCL 5 MG/5ML IJ SOLN
INTRAMUSCULAR | Status: DC | PRN
  Administered 2023-01-03 (×2): 1 mg via INTRAVENOUS

## 2023-01-03 MED ORDER — DOCUSATE SODIUM 100 MG PO CAPS
100.0000 mg | ORAL_CAPSULE | Freq: Two times a day (BID) | ORAL | Status: DC
  Administered 2023-01-04 – 2023-01-07 (×6): 100 mg via ORAL
  Filled 2023-01-03 (×7): qty 1

## 2023-01-03 MED ORDER — PIPERACILLIN-TAZOBACTAM 3.375 G IVPB
3.3750 g | Freq: Three times a day (TID) | INTRAVENOUS | Status: AC
  Administered 2023-01-03 – 2023-01-07 (×12): 3.375 g via INTRAVENOUS
  Filled 2023-01-03 (×12): qty 50

## 2023-01-03 MED ORDER — METHOCARBAMOL 1000 MG/10ML IJ SOLN
1000.0000 mg | Freq: Three times a day (TID) | INTRAVENOUS | Status: DC
  Administered 2023-01-03 – 2023-01-04 (×2): 1000 mg via INTRAVENOUS
  Filled 2023-01-03: qty 1000

## 2023-01-03 MED ORDER — HYDRALAZINE HCL 20 MG/ML IJ SOLN: 10.0000 mg | INTRAMUSCULAR | Status: DC | PRN

## 2023-01-03 MED ORDER — OXYCODONE HCL 5 MG/5ML PO SOLN: 5.0000 mg | Freq: Once | ORAL | Status: DC | PRN

## 2023-01-03 MED ORDER — LIDOCAINE 2% (20 MG/ML) 5 ML SYRINGE
INTRAMUSCULAR | Status: DC | PRN
  Administered 2023-01-03: 20 mg via INTRAVENOUS

## 2023-01-03 MED ORDER — ONDANSETRON HCL 4 MG/2ML IJ SOLN
INTRAMUSCULAR | Status: DC | PRN
  Administered 2023-01-03: 4 mg via INTRAVENOUS

## 2023-01-03 MED ORDER — FENTANYL CITRATE (PF) 250 MCG/5ML IJ SOLN
INTRAMUSCULAR | Status: AC
  Filled 2023-01-03: qty 5

## 2023-01-03 MED ORDER — MIDAZOLAM HCL 2 MG/2ML IJ SOLN
INTRAMUSCULAR | Status: AC
  Filled 2023-01-03: qty 2

## 2023-01-03 MED ORDER — TETANUS-DIPHTH-ACELL PERTUSSIS 5-2.5-18.5 LF-MCG/0.5 IM SUSY
0.5000 mL | PREFILLED_SYRINGE | Freq: Once | INTRAMUSCULAR | Status: AC
  Administered 2023-01-03: 0.5 mL via INTRAMUSCULAR

## 2023-01-03 MED ORDER — HYDROMORPHONE HCL 1 MG/ML IJ SOLN: 0.2500 mg | INTRAMUSCULAR | Status: DC | PRN

## 2023-01-03 MED ORDER — MORPHINE SULFATE (PF) 4 MG/ML IV SOLN
4.0000 mg | INTRAVENOUS | Status: DC | PRN
  Administered 2023-01-03 – 2023-01-06 (×10): 4 mg via INTRAVENOUS
  Filled 2023-01-03 (×11): qty 1

## 2023-01-03 MED ORDER — ACETAMINOPHEN 10 MG/ML IV SOLN
1000.0000 mg | Freq: Four times a day (QID) | INTRAVENOUS | Status: AC
  Administered 2023-01-03 – 2023-01-04 (×3): 1000 mg via INTRAVENOUS
  Filled 2023-01-03 (×4): qty 100

## 2023-01-03 MED ORDER — SODIUM CHLORIDE 0.9 % IV SOLN: INTRAVENOUS | Status: DC | PRN

## 2023-01-03 MED ORDER — OXYCODONE HCL 5 MG PO TABS
5.0000 mg | ORAL_TABLET | ORAL | Status: DC | PRN
  Administered 2023-01-06 – 2023-01-08 (×11): 10 mg via ORAL
  Filled 2023-01-03 (×13): qty 2

## 2023-01-03 MED ORDER — ENOXAPARIN SODIUM 30 MG/0.3ML IJ SOSY
30.0000 mg | PREFILLED_SYRINGE | Freq: Two times a day (BID) | INTRAMUSCULAR | Status: DC
  Administered 2023-01-04 – 2023-01-07 (×8): 30 mg via SUBCUTANEOUS
  Filled 2023-01-03 (×8): qty 0.3

## 2023-01-03 MED ORDER — OXYCODONE HCL 5 MG PO TABS: 5.0000 mg | ORAL_TABLET | Freq: Once | ORAL | Status: DC | PRN

## 2023-01-03 MED ORDER — ROCURONIUM BROMIDE 10 MG/ML (PF) SYRINGE
PREFILLED_SYRINGE | INTRAVENOUS | Status: DC | PRN
  Administered 2023-01-03: 10 mg via INTRAVENOUS
  Administered 2023-01-03: 40 mg via INTRAVENOUS

## 2023-01-03 MED ORDER — SUCCINYLCHOLINE CHLORIDE 200 MG/10ML IV SOSY
PREFILLED_SYRINGE | INTRAVENOUS | Status: DC | PRN
  Administered 2023-01-03: 160 mg via INTRAVENOUS

## 2023-01-03 MED ORDER — SUGAMMADEX SODIUM 200 MG/2ML IV SOLN
INTRAVENOUS | Status: DC | PRN
  Administered 2023-01-03: 200 mg via INTRAVENOUS

## 2023-01-03 SURGICAL SUPPLY — 46 items
APL PRP STRL LF DISP 70% ISPRP (MISCELLANEOUS) ×2
BLADE CLIPPER SURG (BLADE) IMPLANT
CANISTER SUCT 3000ML PPV (MISCELLANEOUS) ×3 IMPLANT
CANISTER WOUNDNEG PRESSURE 500 (CANNISTER) IMPLANT
CHLORAPREP W/TINT 26 (MISCELLANEOUS) ×3 IMPLANT
COVER SURGICAL LIGHT HANDLE (MISCELLANEOUS) ×3 IMPLANT
DRAPE LAPAROSCOPIC ABDOMINAL (DRAPES) ×3 IMPLANT
DRAPE UNIVERSAL (DRAPES) ×3 IMPLANT
DRAPE WARM FLUID 44X44 (DRAPES) ×3 IMPLANT
DRESSING MEPILEX FLEX 4X4 (GAUZE/BANDAGES/DRESSINGS) IMPLANT
DRSG MEPILEX FLEX 4X4 (GAUZE/BANDAGES/DRESSINGS) ×2
DRSG OPSITE POSTOP 4X10 (GAUZE/BANDAGES/DRESSINGS) IMPLANT
DRSG OPSITE POSTOP 4X8 (GAUZE/BANDAGES/DRESSINGS) IMPLANT
DRSG VAC GRANUFOAM MED (GAUZE/BANDAGES/DRESSINGS) IMPLANT
ELECT BLADE 6.5 EXT (BLADE) IMPLANT
ELECT CAUTERY BLADE 6.4 (BLADE) ×3 IMPLANT
ELECT REM PT RETURN 9FT ADLT (ELECTROSURGICAL) ×2
ELECTRODE REM PT RTRN 9FT ADLT (ELECTROSURGICAL) ×3 IMPLANT
GLOVE BIO SURGEON STRL SZ 6.5 (GLOVE) ×3 IMPLANT
GLOVE BIOGEL PI IND STRL 6 (GLOVE) ×3 IMPLANT
GOWN STRL REUS W/ TWL LRG LVL3 (GOWN DISPOSABLE) ×6 IMPLANT
GOWN STRL REUS W/TWL LRG LVL3 (GOWN DISPOSABLE) ×4
HANDLE SUCTION POOLE (INSTRUMENTS) ×3 IMPLANT
KIT BASIN OR (CUSTOM PROCEDURE TRAY) ×3 IMPLANT
KIT TURNOVER KIT B (KITS) ×3 IMPLANT
LIGASURE IMPACT 36 18CM CVD LR (INSTRUMENTS) IMPLANT
NS IRRIG 1000ML POUR BTL (IV SOLUTION) ×6 IMPLANT
PACK GENERAL/GYN (CUSTOM PROCEDURE TRAY) ×3 IMPLANT
PAD ARMBOARD 7.5X6 YLW CONV (MISCELLANEOUS) ×3 IMPLANT
PENCIL SMOKE EVACUATOR (MISCELLANEOUS) ×3 IMPLANT
RELOAD LINEAR CUT PROX 55 BLUE (ENDOMECHANICALS) ×16 IMPLANT
RELOAD STAPLE 55 3.8 BLU REG (ENDOMECHANICALS) IMPLANT
SPONGE T-LAP 18X18 ~~LOC~~+RFID (SPONGE) IMPLANT
STAPLER PROXIMATE 55 BLUE (STAPLE) IMPLANT
STAPLER VISISTAT 35W (STAPLE) ×3 IMPLANT
SUCTION POOLE HANDLE (INSTRUMENTS) ×2
SUT PDS AB 1 TP1 54 (SUTURE) IMPLANT
SUT PDS AB 1 TP1 96 (SUTURE) IMPLANT
SUT SILK 2 0 SH CR/8 (SUTURE) ×3 IMPLANT
SUT SILK 2 0 TIES 10X30 (SUTURE) ×3 IMPLANT
SUT SILK 3 0 SH CR/8 (SUTURE) ×3 IMPLANT
SUT SILK 3 0 TIES 10X30 (SUTURE) ×3 IMPLANT
SUT VIC AB 3-0 SH 18 (SUTURE) IMPLANT
TOWEL GREEN STERILE (TOWEL DISPOSABLE) ×3 IMPLANT
TRAY FOLEY MTR SLVR 16FR STAT (SET/KITS/TRAYS/PACK) IMPLANT
YANKAUER SUCT BULB TIP NO VENT (SUCTIONS) IMPLANT
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
## 2023-01-03 NOTE — Anesthesia Procedure Notes (Signed)
Procedure Name: Intubation Date/Time: 01/03/2023 7:19 AM  Performed by: Rachel Moulds, CRNAPre-anesthesia Checklist: Patient identified, Emergency Drugs available, Suction available, Patient being monitored and Timeout performed Patient Re-evaluated:Patient Re-evaluated prior to induction Oxygen Delivery Method: Circle system utilized Preoxygenation: Pre-oxygenation with 100% oxygen Induction Type: IV induction, Rapid sequence and Cricoid Pressure applied Laryngoscope Size: Mac and 4 Grade View: Grade I Tube type: Oral Tube size: 8.0 mm Number of attempts: 1 Airway Equipment and Method: Stylet Placement Confirmation: ETT inserted through vocal cords under direct vision, positive ETCO2, CO2 detector and breath sounds checked- equal and bilateral Secured at: 23 cm Tube secured with: Tape Dental Injury: Teeth and Oropharynx as per pre-operative assessment
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
## 2023-01-04 ENCOUNTER — Encounter (HOSPITAL_COMMUNITY): Payer: Self-pay | Admitting: Surgery

## 2023-01-04 LAB — CBC
HCT: 32.8 % — ABNORMAL LOW (ref 39.0–52.0)
Hemoglobin: 10.9 g/dL — ABNORMAL LOW (ref 13.0–17.0)
MCH: 31 pg (ref 26.0–34.0)
MCHC: 33.2 g/dL (ref 30.0–36.0)
MCV: 93.2 fL (ref 80.0–100.0)
Platelets: 268 10*3/uL (ref 150–400)
RBC: 3.52 MIL/uL — ABNORMAL LOW (ref 4.22–5.81)
RDW: 13.1 % (ref 11.5–15.5)
WBC: 9.3 10*3/uL (ref 4.0–10.5)
nRBC: 0 % (ref 0.0–0.2)

## 2023-01-04 LAB — HIV ANTIBODY (ROUTINE TESTING W REFLEX): HIV Screen 4th Generation wRfx: NONREACTIVE

## 2023-01-04 LAB — BASIC METABOLIC PANEL
Anion gap: 9 (ref 5–15)
BUN: 18 mg/dL (ref 6–20)
CO2: 25 mmol/L (ref 22–32)
Calcium: 8.6 mg/dL — ABNORMAL LOW (ref 8.9–10.3)
Chloride: 102 mmol/L (ref 98–111)
Creatinine, Ser: 0.94 mg/dL (ref 0.61–1.24)
GFR, Estimated: >60 mL/min (ref >60)
Glucose, Bld: 92 mg/dL (ref 70–99)
Potassium: 3.7 mmol/L (ref 3.5–5.1)
Sodium: 136 mmol/L (ref 135–145)

## 2023-01-04 MED ORDER — LACTATED RINGERS IV SOLN: INTRAVENOUS | Status: DC

## 2023-01-04 MED ORDER — METHOCARBAMOL 1000 MG/10ML IJ SOLN
1000.0000 mg | Freq: Three times a day (TID) | INTRAVENOUS | Status: DC
  Administered 2023-01-04 – 2023-01-06 (×6): 1000 mg via INTRAVENOUS
  Filled 2023-01-04 (×3): qty 10
  Filled 2023-01-04: qty 1000
  Filled 2023-01-04 (×2): qty 10
  Filled 2023-01-04: qty 1000
  Filled 2023-01-04: qty 10
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
## 2023-01-04 NOTE — Progress Notes (Signed)
Patient medicated for pain of 9, reported relief from medication administration at this time.
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
## 2023-01-04 NOTE — Plan of Care (Signed)
  Problem: Pain Managment: Goal: General experience of comfort will improve Outcome: Progressing   Problem: Safety: Goal: Ability to remain free from injury will improve Outcome: Progressing   

## 2023-01-05 MED ORDER — CHLORHEXIDINE GLUCONATE CLOTH 2 % EX PADS
6.0000 | MEDICATED_PAD | Freq: Every day | CUTANEOUS | Status: DC
  Administered 2023-01-05: 6 via TOPICAL
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
## 2023-01-05 NOTE — Progress Notes (Signed)
RN pulled pt foley at 10am and pt tolerated well, pt then stated he felt like he needed to pee. RN assisted pt to the bathroom x1 assist and he voided.
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
## 2023-01-06 LAB — SURGICAL PATHOLOGY

## 2023-01-06 MED ORDER — POLYETHYLENE GLYCOL 3350 17 G PO PACK
17.0000 g | PACK | Freq: Every day | ORAL | Status: DC
  Administered 2023-01-06 – 2023-01-07 (×2): 17 g via ORAL
  Filled 2023-01-06: qty 1

## 2023-01-06 MED ORDER — METHOCARBAMOL 500 MG PO TABS
1000.0000 mg | ORAL_TABLET | Freq: Three times a day (TID) | ORAL | Status: DC
  Administered 2023-01-06 – 2023-01-08 (×6): 1000 mg via ORAL
  Filled 2023-01-06 (×6): qty 2

## 2023-01-06 NOTE — Discharge Instructions (Signed)
Wet to Dry WOUND CARE: - Change dressing once daily - Supplies: sterile saline, kerlex, scissors, ABD pads, tape  Remove dressing and all packing carefully, moistening with sterile saline as needed to avoid packing/internal dressing sticking to the wound. 2.   Clean edges of skin around the wound with water/gauze, making sure there is no tape debris or leakage left on skin that could cause skin irritation or breakdown. 3.   Dampen and clean kerlex with sterile saline and pack wound from wound base to skin level, making sure to take note of any possible areas of wound tracking, tunneling and packing appropriately. Wound can be packed loosely. Trim kerlex to size if a whole kerlex is not required. 4.   Cover wound with a dry ABD pad and secure with tape.  5.   Write the date/time on the dry dressing/tape to better track when the last dressing change occurred. - apply any skin protectant/powder if recommended by clinician to protect skin/skin folds. - change dressing as needed if leakage occurs, wound gets contaminated, or patient requests to shower. - You may shower daily with wound open and following the shower the wound should be dried and a clean dressing placed.  - Medical grade tape as well as packing supplies can be found at [employer] on [address] or [employer] on [address]. The remaining supplies can be found at your local drug store, [employer] etc.    CC[facility]      [facility], [place] [phone]  OPEN ABDOMINAL SURGERY: POST OP INSTRUCTIONS  Always review your discharge instruction sheet given to you by the facility where your surgery was performed.  IF YOU HAVE DISABILITY OR FAMILY LEAVE FORMS, YOU MUST BRING THEM TO THE OFFICE FOR PROCESSING.  PLEASE DO NOT GIVE THEM TO YOUR DOCTOR.  A prescription for pain medication may be given to you upon discharge.  Take your pain medication as prescribed, if needed.  If narcotic pain medicine is not  needed, then you may take acetaminophen (Tylenol) or ibuprofen (Advil) as needed. Take your usually prescribed medications unless otherwise directed. If you need a refill on your pain medication, please contact your pharmacy. They will contact our office to request authorization.  Prescriptions will not be filled after 5pm or on week-ends. You should follow a light diet the first few days after arrival home, such as soup and crackers, pudding, etc.unless your doctor has advised otherwise. A high-fiber, low fat diet can be resumed as tolerated.   Be sure to include lots of fluids daily. Most patients will experience some swelling and bruising on the chest and neck area.  Ice packs will help.  Swelling and bruising can take several days to resolve Most patients will experience some swelling and bruising in the area of the incision. Ice pack will help. Swelling and bruising can take several days to resolve..  It is common to experience some constipation if taking pain medication after surgery.  Increasing fluid intake and taking a stool softener will usually help or prevent this problem from occurring.  A mild laxative (Milk of Magnesia or Miralax) should be taken according to package directions if there are no bowel movements after 48 hours.  You may have steri-strips (small skin tapes) in place directly over the incision.  These strips should be left on the skin for 7-10 days.  If your surgeon used skin glue on the incision, you may shower in 24 hours.  The glue will flake off  over the next 2-3 weeks.  Any sutures or staples will be removed at the office during your follow-up visit. You may find that a light gauze bandage over your incision may keep your staples from being rubbed or pulled. You may shower and replace the bandage daily. ACTIVITIES:  You may resume regular (light) daily activities beginning the next day--such as daily self-care, walking, climbing stairs--gradually increasing activities as  tolerated.  You may have sexual intercourse when it is comfortable.  Refrain from any heavy lifting or straining until approved by your doctor. You may drive when you no longer are taking prescription pain medication, you can comfortably wear a seatbelt, and you can safely maneuver your car and apply brakes Return to Work: ___________________________________ Bonita Quin should see your doctor in the office for a follow-up appointment approximately two weeks after your surgery.  Make sure that you call for this appointment within a day or two after you arrive home to insure a convenient appointment time. OTHER INSTRUCTIONS:  _____________________________________________________________ _____________________________________________________________  WHEN TO CALL YOUR DOCTOR: Fever over 101.0 Inability to urinate Nausea and/or vomiting Extreme swelling or bruising Continued bleeding from incision. Increased pain, redness, or drainage from the incision. Difficulty swallowing or breathing Muscle cramping or spasms. Numbness or tingling in hands or feet or around lips.  The clinic staff is available to answer your questions during regular business hours.  Please don't hesitate to call and ask to speak to one of the nurses if you have concerns.  For further questions, please visit [email or web address]

## 2023-01-06 NOTE — Plan of Care (Signed)
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
## 2023-01-06 NOTE — Progress Notes (Signed)
   01/06/23 1600  Mobility  Activity Refused mobility  Mobility Specialist Start Time (ACUTE ONLY) 1555  Mobility Specialist Stop Time (ACUTE ONLY) 1557  Mobility Specialist Time Calculation (min) (ACUTE ONLY) 2 min   Mobility Specialist: Progress Note  Pt refused mobility session d/t walking independently throughout the day - Received and left in bed with all needs met. Call bell within reach.   Barnie Mort, BS Mobility Specialist Please contact via SecureChat or Rehab office at [phone].

## 2023-01-07 ENCOUNTER — Other Ambulatory Visit (HOSPITAL_COMMUNITY): Payer: Self-pay

## 2023-01-07 LAB — CBC
HCT: 29.4 % — ABNORMAL LOW (ref 39.0–52.0)
Hemoglobin: 9.8 g/dL — ABNORMAL LOW (ref 13.0–17.0)
MCH: 30.9 pg (ref 26.0–34.0)
MCHC: 33.3 g/dL (ref 30.0–36.0)
MCV: 92.7 fL (ref 80.0–100.0)
Platelets: 290 K/uL (ref 150–400)
RBC: 3.17 MIL/uL — ABNORMAL LOW (ref 4.22–5.81)
RDW: 13 % (ref 11.5–15.5)
WBC: 5.9 K/uL (ref 4.0–10.5)
nRBC: 0 % (ref 0.0–0.2)

## 2023-01-07 LAB — MAGNESIUM: Magnesium: 1.7 mg/dL (ref 1.7–2.4)

## 2023-01-07 LAB — BASIC METABOLIC PANEL WITH GFR
Anion gap: 9 (ref 5–15)
BUN: 12 mg/dL (ref 6–20)
CO2: 24 mmol/L (ref 22–32)
Calcium: 8.1 mg/dL — ABNORMAL LOW (ref 8.9–10.3)
Chloride: 103 mmol/L (ref 98–111)
Creatinine, Ser: 0.81 mg/dL (ref 0.61–1.24)
GFR, Estimated: >60 mL/min
Glucose, Bld: 114 mg/dL — ABNORMAL HIGH (ref 70–99)
Potassium: 3.7 mmol/L (ref 3.5–5.1)
Sodium: 136 mmol/L (ref 135–145)

## 2023-01-07 MED ORDER — OXYCODONE HCL 10 MG PO TABS
5.0000 mg | ORAL_TABLET | Freq: Four times a day (QID) | ORAL | 0 refills | Status: AC | PRN
  Filled 2023-01-07: qty 20, 5d supply, fill #0

## 2023-01-07 MED ORDER — METHOCARBAMOL 500 MG PO TABS
500.0000 mg | ORAL_TABLET | Freq: Four times a day (QID) | ORAL | 0 refills | Status: AC | PRN
  Filled 2023-01-07: qty 30, 4d supply, fill #0

## 2023-01-07 MED ORDER — POLYETHYLENE GLYCOL 3350 17 GM/SCOOP PO POWD
17.0000 g | Freq: Every day | ORAL | 0 refills | Status: AC | PRN
  Filled 2023-01-07: qty 238, 14d supply, fill #0
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
## 2023-01-07 NOTE — Progress Notes (Signed)
Completed wound care education with patient. Patient performed wound care independently and stated understanding of how to complete dressing change.
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
## 2023-01-07 NOTE — Plan of Care (Signed)
Pt alert and oriented x 4. Up stand by. Pt has received 2 doses of pain medication this shift. 1 dose of morphine and 1 dose of oxy. Pt reports no bm. Colace given per order. Bs hypoactive. Passing gas. Vitals stable. Pt refused 2000 vitals. Pt did allow obtained at 2300. Dsg changed and wound red, pink with small area to bottom right that has black areas. Tried to educate pt on dsg when performing. Pt not attentive.  Problem: Education: Goal: Knowledge of General Education information will improve Description: Including pain rating scale, medication(s)/side effects and non-pharmacologic comfort measures Outcome: Progressing   Problem: Health Behavior/Discharge Planning: Goal: Ability to manage health-related needs will improve Outcome: Progressing   Problem: Clinical Measurements: Goal: Ability to maintain clinical measurements within normal limits will improve Outcome: Progressing Goal: Will remain free from infection Outcome: Progressing Goal: Diagnostic test results will improve Outcome: Progressing Goal: Respiratory complications will improve Outcome: Progressing Goal: Cardiovascular complication will be avoided Outcome: Progressing   Problem: Activity: Goal: Risk for activity intolerance will decrease Outcome: Progressing   Problem: Nutrition: Goal: Adequate nutrition will be maintained Outcome: Progressing   Problem: Coping: Goal: Level of anxiety will decrease Outcome: Progressing   Problem: Elimination: Goal: Will not experience complications related to bowel motility Outcome: Progressing Goal: Will not experience complications related to urinary retention Outcome: Progressing   Problem: Pain Managment: Goal: General experience of comfort will improve Outcome: Progressing   Problem: Safety: Goal: Ability to remain free from injury will improve Outcome: Progressing   Problem: Skin Integrity: Goal: Risk for impaired skin integrity will decrease Outcome:  Progressing

## 2023-01-08 ENCOUNTER — Encounter (HOSPITAL_COMMUNITY): Payer: Self-pay | Admitting: *Deleted

## 2023-01-08 NOTE — Plan of Care (Signed)
°  Problem: Clinical Measurements: ?Goal: Will remain free from infection ?Outcome: Not Progressing ?  ?Problem: Coping: ?Goal: Level of anxiety will decrease ?Outcome: Not Progressing ?  ?Problem: Elimination: ?Goal: Will not experience complications related to bowel motility ?Outcome: Not Progressing ?  ?Problem: Pain Managment: ?Goal: General experience of comfort will improve ?Outcome: Not Progressing ?  ?

## 2023-01-08 NOTE — Progress Notes (Signed)
   01/08/23 0509  Vital Signs  Temp  (PT refused all vital signs Rn was notified.)

## 2023-01-17 ENCOUNTER — Other Ambulatory Visit: Payer: Self-pay

## 2023-01-17 ENCOUNTER — Encounter (HOSPITAL_COMMUNITY): Payer: Self-pay | Admitting: *Deleted

## 2023-01-17 ENCOUNTER — Emergency Department (HOSPITAL_COMMUNITY): Payer: BLUE CROSS/BLUE SHIELD

## 2023-01-17 ENCOUNTER — Emergency Department (HOSPITAL_COMMUNITY)
Admission: EM | Admit: 2023-01-17 | Discharge: 2023-01-17 | Disposition: A | Discharge status: Home / Self Care | Payer: BLUE CROSS/BLUE SHIELD | Attending: Emergency Medicine | Admitting: Emergency Medicine

## 2023-01-17 DIAGNOSIS — R1084 Generalized abdominal pain: Secondary | ICD-10-CM | POA: Diagnosis present

## 2023-01-17 LAB — CBC WITH DIFFERENTIAL/PLATELET
Abs Immature Granulocytes: 0.03 10*3/uL (ref 0.00–0.07)
Basophils Absolute: 0 10*3/uL (ref 0.0–0.1)
Basophils Relative: 1 %
Eosinophils Absolute: 0.1 10*3/uL (ref 0.0–0.5)
Eosinophils Relative: 2 %
HCT: 32 % — ABNORMAL LOW (ref 39.0–52.0)
Hemoglobin: 10.6 g/dL — ABNORMAL LOW (ref 13.0–17.0)
Immature Granulocytes: 0 %
Lymphocytes Relative: 24 %
Lymphs Abs: 1.9 10*3/uL (ref 0.7–4.0)
MCH: 30.9 pg (ref 26.0–34.0)
MCHC: 33.1 g/dL (ref 30.0–36.0)
MCV: 93.3 fL (ref 80.0–100.0)
Monocytes Absolute: 0.4 10*3/uL (ref 0.1–1.0)
Monocytes Relative: 5 %
Neutro Abs: 5.3 10*3/uL (ref 1.7–7.7)
Neutrophils Relative %: 68 %
Platelets: 519 10*3/uL — ABNORMAL HIGH (ref 150–400)
RBC: 3.43 MIL/uL — ABNORMAL LOW (ref 4.22–5.81)
RDW: 13.9 % (ref 11.5–15.5)
WBC: 7.8 10*3/uL (ref 4.0–10.5)
nRBC: 0 % (ref 0.0–0.2)

## 2023-01-17 LAB — COMPREHENSIVE METABOLIC PANEL
ALT: 24 U/L (ref 0–44)
AST: 14 U/L — ABNORMAL LOW (ref 15–41)
Albumin: 3.4 g/dL — ABNORMAL LOW (ref 3.5–5.0)
Alkaline Phosphatase: 84 U/L (ref 38–126)
Anion gap: 9 (ref 5–15)
BUN: 8 mg/dL (ref 6–20)
CO2: 23 mmol/L (ref 22–32)
Calcium: 8.7 mg/dL — ABNORMAL LOW (ref 8.9–10.3)
Chloride: 104 mmol/L (ref 98–111)
Creatinine, Ser: 0.67 mg/dL (ref 0.61–1.24)
GFR, Estimated: >60 mL/min (ref >60)
Glucose, Bld: 115 mg/dL — ABNORMAL HIGH (ref 70–99)
Potassium: 3.4 mmol/L — ABNORMAL LOW (ref 3.5–5.1)
Sodium: 136 mmol/L (ref 135–145)
Total Bilirubin: 0.5 mg/dL (ref 0.3–1.2)
Total Protein: 6.2 g/dL — ABNORMAL LOW (ref 6.5–8.1)

## 2023-01-17 LAB — URINALYSIS, ROUTINE W REFLEX MICROSCOPIC
Bilirubin Urine: NEGATIVE
Glucose, UA: NEGATIVE mg/dL
Hgb urine dipstick: NEGATIVE
Ketones, ur: NEGATIVE mg/dL
Leukocytes,Ua: NEGATIVE
Nitrite: NEGATIVE
Protein, ur: NEGATIVE mg/dL
Specific Gravity, Urine: 1.024 (ref 1.005–1.030)
pH: 6 (ref 5.0–8.0)

## 2023-01-17 LAB — LIPASE, BLOOD: Lipase: 24 U/L (ref 11–51)

## 2023-01-17 MED ORDER — IOHEXOL 350 MG/ML SOLN
75.0000 mL | Freq: Once | INTRAVENOUS | Status: AC | PRN
  Administered 2023-01-17: 75 mL via INTRAVENOUS

## 2023-01-17 MED ORDER — MORPHINE SULFATE (PF) 4 MG/ML IV SOLN
4.0000 mg | Freq: Once | INTRAVENOUS | Status: AC
  Administered 2023-01-17: 4 mg via INTRAVENOUS
  Filled 2023-01-17: qty 1
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
## 2023-01-17 NOTE — Discharge Instructions (Signed)
Return for any problem.  ?
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
## 2023-01-19 NOTE — Plan of Care (Signed)
[facility] Tonsillectomy/Adenoidectomy, Postoperative PEDS care plan entered in error.

## 2023-01-28 ENCOUNTER — Emergency Department (HOSPITAL_COMMUNITY)
Admission: EM | Admit: 2023-01-28 | Discharge: 2023-01-28 | Disposition: A | Discharge status: Home / Self Care | Payer: BLUE CROSS/BLUE SHIELD | Attending: Emergency Medicine | Admitting: Emergency Medicine

## 2023-01-28 ENCOUNTER — Encounter (HOSPITAL_COMMUNITY): Payer: Self-pay | Admitting: Emergency Medicine

## 2023-01-28 ENCOUNTER — Other Ambulatory Visit: Payer: Self-pay

## 2023-01-28 DIAGNOSIS — F409 Phobic anxiety disorder, unspecified: Secondary | ICD-10-CM | POA: Diagnosis present

## 2023-01-28 NOTE — ED Notes (Signed)
  Patient given sandwich and drink with discharge papers.  Patient instructed to leave when he was done.  Patient verbally understood.
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
## 2023-03-15 ENCOUNTER — Emergency Department (HOSPITAL_COMMUNITY)
Admission: EM | Admit: 2023-03-15 | Discharge: 2023-03-15 | Disposition: A | Discharge status: Home / Self Care | Payer: BLUE CROSS/BLUE SHIELD | Attending: Emergency Medicine | Admitting: Emergency Medicine

## 2023-03-15 ENCOUNTER — Encounter (HOSPITAL_COMMUNITY): Payer: Self-pay

## 2023-03-15 ENCOUNTER — Other Ambulatory Visit: Payer: Self-pay

## 2023-03-15 DIAGNOSIS — L02415 Cutaneous abscess of right lower limb: Secondary | ICD-10-CM | POA: Diagnosis present

## 2023-03-15 DIAGNOSIS — L02419 Cutaneous abscess of limb, unspecified: Secondary | ICD-10-CM

## 2023-03-15 DIAGNOSIS — L03115 Cellulitis of right lower limb: Secondary | ICD-10-CM | POA: Diagnosis not present

## 2023-03-15 MED ORDER — CEPHALEXIN 500 MG PO CAPS
500.0000 mg | ORAL_CAPSULE | Freq: Once | ORAL | Status: AC
  Administered 2023-03-15: 500 mg via ORAL
  Filled 2023-03-15: qty 1

## 2023-03-15 MED ORDER — DOXYCYCLINE HYCLATE 100 MG PO TABS
100.0000 mg | ORAL_TABLET | Freq: Once | ORAL | Status: AC
  Administered 2023-03-15: 100 mg via ORAL
  Filled 2023-03-15: qty 1

## 2023-03-15 MED ORDER — DOXYCYCLINE HYCLATE 100 MG PO CAPS: 100.0000 mg | ORAL_CAPSULE | Freq: Two times a day (BID) | ORAL | 0 refills | Status: AC

## 2023-03-15 MED ORDER — LIDOCAINE-EPINEPHRINE (PF) 2 %-1:200000 IJ SOLN
INTRAMUSCULAR | Status: AC
  Administered 2023-03-15: 10 mL
  Filled 2023-03-15: qty 20

## 2023-03-15 MED ORDER — CEPHALEXIN 500 MG PO CAPS: 500.0000 mg | ORAL_CAPSULE | Freq: Four times a day (QID) | ORAL | 0 refills | Status: AC

## 2023-03-15 MED ORDER — LIDOCAINE-EPINEPHRINE (PF) 2 %-1:200000 IJ SOLN: 10.0000 mL | Freq: Once | INTRAMUSCULAR | Status: AC

## 2023-03-15 NOTE — Discharge Instructions (Signed)
Change the dressing everyday and wash the knee with soap and water.  Elevated you leg when you can and use the ace wrap to help with swelling.  Return to the ER in 3-4 days for a recheck if your knee is not getting better.
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
## 2023-03-16 ENCOUNTER — Encounter (HOSPITAL_COMMUNITY): Payer: Self-pay | Admitting: Emergency Medicine

## 2023-03-16 ENCOUNTER — Emergency Department (HOSPITAL_COMMUNITY)
Admission: EM | Admit: 2023-03-16 | Discharge: 2023-03-16 | Disposition: A | Discharge status: Against Medical Advice | Payer: BLUE CROSS/BLUE SHIELD | Attending: Emergency Medicine | Admitting: Emergency Medicine

## 2023-03-16 DIAGNOSIS — M25561 Pain in right knee: Secondary | ICD-10-CM | POA: Diagnosis present

## 2023-03-16 DIAGNOSIS — M25461 Effusion, right knee: Secondary | ICD-10-CM | POA: Insufficient documentation

## 2023-03-16 DIAGNOSIS — Z5321 Procedure and treatment not carried out due to patient leaving prior to being seen by health care provider: Secondary | ICD-10-CM | POA: Diagnosis not present

## 2023-03-16 NOTE — ED Triage Notes (Signed)
R knee pain and swelling.

## 2023-03-16 NOTE — ED Notes (Signed)
PT denies filling his px for antibiotics and states that he needs it transferred to pharmacy that is open. RN confirmed he has not started antibiotics. R leg is swollen in calf and foot. No redness noted. Pt then exited the room and stated he was going home to do some "organic medicine like his grandfather used". RN encouraged him to stay to see MD and at least get px transferred. He declined.

## 2023-09-07 ENCOUNTER — Emergency Department (HOSPITAL_COMMUNITY)
Admission: EM | Admit: 2023-09-07 | Discharge: 2023-09-07 | Discharge status: Against Medical Advice | Attending: Emergency Medicine | Admitting: Emergency Medicine

## 2023-09-07 ENCOUNTER — Encounter (HOSPITAL_COMMUNITY): Payer: Self-pay

## 2023-09-07 DIAGNOSIS — Z5321 Procedure and treatment not carried out due to patient leaving prior to being seen by health care provider: Secondary | ICD-10-CM | POA: Diagnosis not present

## 2023-09-07 DIAGNOSIS — M79671 Pain in right foot: Secondary | ICD-10-CM | POA: Insufficient documentation

## 2023-09-07 DIAGNOSIS — M79672 Pain in left foot: Secondary | ICD-10-CM | POA: Insufficient documentation

## 2023-09-07 NOTE — ED Triage Notes (Signed)
 Patient presents with bilateral pain to both feet from walking. Feet appear swollen, with blisters on the top and bottom of his feet.

## 2023-09-07 NOTE — ED Notes (Addendum)
 During triage patient states he is leaving to [place]. Patient walked out of the triage room and into the lobby.
